# Patient Record
Sex: Female | Born: 1965 | Race: White | Hispanic: No | Smoking: Former smoker
Health system: Southern US, Community
[De-identification: ages and names within clinical notes are randomized; demographics above are authoritative.]

## PROBLEM LIST (undated history)

## (undated) DIAGNOSIS — F32A Depression, unspecified: Secondary | ICD-10-CM

## (undated) DIAGNOSIS — T7840XA Allergy, unspecified, initial encounter: Secondary | ICD-10-CM

## (undated) DIAGNOSIS — F329 Major depressive disorder, single episode, unspecified: Secondary | ICD-10-CM

## (undated) DIAGNOSIS — R87619 Unspecified abnormal cytological findings in specimens from cervix uteri: Secondary | ICD-10-CM

## (undated) DIAGNOSIS — I341 Nonrheumatic mitral (valve) prolapse: Secondary | ICD-10-CM

## (undated) DIAGNOSIS — R87810 Cervical high risk human papillomavirus (HPV) DNA test positive: Secondary | ICD-10-CM

## (undated) DIAGNOSIS — J339 Nasal polyp, unspecified: Secondary | ICD-10-CM

## (undated) DIAGNOSIS — K219 Gastro-esophageal reflux disease without esophagitis: Secondary | ICD-10-CM

## (undated) HISTORY — PX: COLPOSCOPY: SHX161

## (undated) HISTORY — DX: Depression, unspecified: F32.A

## (undated) HISTORY — DX: Cervical high risk human papillomavirus (HPV) DNA test positive: R87.810

## (undated) HISTORY — DX: Allergy, unspecified, initial encounter: T78.40XA

## (undated) HISTORY — DX: Major depressive disorder, single episode, unspecified: F32.9

## (undated) HISTORY — DX: Nonrheumatic mitral (valve) prolapse: I34.1

## (undated) HISTORY — PX: CHOLECYSTECTOMY: SHX55

## (undated) HISTORY — DX: Gastro-esophageal reflux disease without esophagitis: K21.9

## (undated) HISTORY — DX: Unspecified abnormal cytological findings in specimens from cervix uteri: R87.619

---

## 2012-04-30 ENCOUNTER — Encounter: Payer: Self-pay | Admitting: Family Medicine

## 2013-05-24 ENCOUNTER — Encounter: Payer: Self-pay | Admitting: Family Medicine

## 2016-10-17 LAB — LIPID PANEL
CHOLESTEROL: 240 mg/dL — AB (ref 0–200)
HDL: 82 mg/dL — AB (ref 35–70)
LDL CALC: 126 mg/dL
LDL/HDL RATIO: 1.5
Triglycerides: 159 mg/dL (ref 40–160)

## 2016-10-17 LAB — HEMOGLOBIN A1C: HEMOGLOBIN A1C: 5.5

## 2016-10-17 LAB — HEPATIC FUNCTION PANEL
ALK PHOS: 98 U/L (ref 25–125)
ALT: 13 U/L (ref 7–35)
AST: 16 U/L (ref 13–35)
BILIRUBIN, TOTAL: 0.4 mg/dL

## 2016-10-17 LAB — BASIC METABOLIC PANEL
CREATININE: 1 mg/dL (ref <=1.1)
GLUCOSE: 106 mg/dL

## 2017-01-06 ENCOUNTER — Telehealth: Payer: Self-pay | Admitting: Family Medicine

## 2017-01-06 NOTE — Telephone Encounter (Signed)
Medication: Prozac 10mg  once daily  omprozole 20mg  (currently) Flonase nasal spray   Preferred Pharmacy and which med where: [facility] [address]  90 day supply/mail order:  Local pharmacy:    Allergies verified: Dilaudid/ elevated heart rate   Immunization Status: Patient Unknown  Prompted for insurance verification: BCBS Flu vaccine-- Current  Tdap-- PNA-- Shingles--  A/P:   Changes to FH, PSH or Personal Hx: No  Pap-- Not current/ Needs her at our office 3 years ago  MMG-- about 3 years ago   Bone Density-- Never had CCS-- never ha   Care Teams Updated: ED/Hospital/Urgent Care Visits: No  Prompted for: Updated insurance, contact information, forms: Remind to bring: DPR information, advance directives:   To Discuss with Provider:  Larey SeatFell down some stairs and left leg will not heal. December 16th accident occurred. New to the area.    Haven't been to a dentist on about 5 years

## 2017-01-07 ENCOUNTER — Encounter: Payer: Self-pay | Admitting: Family Medicine

## 2017-01-07 ENCOUNTER — Ambulatory Visit (INDEPENDENT_AMBULATORY_CARE_PROVIDER_SITE_OTHER): Payer: BLUE CROSS/BLUE SHIELD | Admitting: Family Medicine

## 2017-01-07 VITALS — BP 118/78 | HR 65 | Temp 98.4°F | Ht 62.0 in | Wt 188.2 lb

## 2017-01-07 DIAGNOSIS — E669 Obesity, unspecified: Secondary | ICD-10-CM

## 2017-01-07 DIAGNOSIS — S8012XD Contusion of left lower leg, subsequent encounter: Secondary | ICD-10-CM

## 2017-01-07 DIAGNOSIS — K219 Gastro-esophageal reflux disease without esophagitis: Secondary | ICD-10-CM

## 2017-01-07 DIAGNOSIS — J301 Allergic rhinitis due to pollen: Secondary | ICD-10-CM

## 2017-01-07 DIAGNOSIS — Z1239 Encounter for other screening for malignant neoplasm of breast: Secondary | ICD-10-CM

## 2017-01-07 DIAGNOSIS — Z1211 Encounter for screening for malignant neoplasm of colon: Secondary | ICD-10-CM

## 2017-01-07 DIAGNOSIS — Z1231 Encounter for screening mammogram for malignant neoplasm of breast: Secondary | ICD-10-CM

## 2017-01-07 DIAGNOSIS — F341 Dysthymic disorder: Secondary | ICD-10-CM | POA: Diagnosis not present

## 2017-01-07 MED ORDER — FLUOXETINE HCL 10 MG PO TABS: 10.0000 mg | ORAL_TABLET | Freq: Every day | ORAL | 3 refills | Status: DC

## 2017-01-07 NOTE — Progress Notes (Signed)
Brooke DawleyLisa Drake is a 51 y.o. female [facility]   History of Present Illness:    1. Dysthymia. On Prozac, states that it "takes the edge off." [place]. Had trouble selling her home. Her father died. She is divorced from an alcoholic. Found that group counseling, and "12 step" programs have helped her. No substance abuse. Involved in church. Likes to Agricultural consultantvolunteer. Extrovert. No SI/HI. Has teenagers. Needs refills today. Would like to find another "codependency" group if possible.    2. Contusion of left lower leg, subsequent encounter. Fall with left lower leg contusion about tone month ago. Now, has a "dent" in that area. Some numbness and itching in the area. No new symptoms otherwise. No other trauma or treatment.     Health Maintenance Due  Topic Date Due  . Hepatitis C Screening  05/18/1966  . HIV Screening  03/28/1979  . TETANUS/TDAP  03/28/1983  . COLONOSCOPY  03/27/2014 ORDERED TODAY  . MAMMOGRAM  11/16/2015 ORDERED TODAY  . PAP SMEAR  11/15/2016    PMHx, SurgHx, SocialHx, Medications, and Allergies were reviewed in the Visit Navigator and updated as appropriate.    Past Medical History:  Diagnosis Date  . Allergy   . Depression   . GERD (gastroesophageal reflux disease)     Past Surgical History:  Procedure Laterality Date  . CHOLECYSTECTOMY      Family History  Problem Relation Age of Onset  . Cancer Mother   . Mental illness Mother   . Cancer Father   . Heart disease Father   . Heart disease Maternal Grandmother   . Stroke Maternal Grandfather     Social History  Substance Use Topics  . Smoking status: Never Smoker  . Smokeless tobacco: Never Used  . Alcohol use 1.8 - 2.4 oz/week    3 - 4 Glasses of wine per week    Current Medications and Allergies:    Current Outpatient Prescriptions:  .  FLUoxetine (PROZAC) 10 MG tablet, Take 1 tablet (10 mg total) by mouth daily., Disp: 90 tablet, Rfl: 3 .  fluticasone (FLONASE)  50 MCG/ACT nasal spray, Place into both nostrils daily., Disp: , Rfl:  .  omeprazole (PRILOSEC) 10 MG capsule, Take 10 mg by mouth daily., Disp: , Rfl:    Allergies  Allergen Reactions  . Dilaudid [Hydromorphone] Other (See Comments)    Elevated heart rate.      Patient Information Form: Screening and ROS    Review of Systems  General:  Negative for unexplained weight loss, fever Skin: Negative for new or changing mole, sore that won't heal HEENT: Negative for trouble hearing, trouble seeing, ringing in ears, mouth sores, hoarseness, change in voice, dysphagia CV:  Negative for chest pain, dyspnea, edema, palpitations Resp: Negative for cough, dyspnea, hemoptysis GI: Negative for nausea, vomiting, diarrhea, constipation, abdominal pain, melena, hematochezia GU: Negative for dysuria, incontinence, urinary hesitance, hematuria, vaginal discharge, polyuria, sexual difficulty, lumps in breasts MSK: Negative for muscle cramps or aches, joint pain or swelling Neuro: Negative for headaches, weakness, numbness, dizziness, passing out/fainting Psych: Negative for depression, anxiety, memory problems   Vitals:   Vitals:   01/07/17 1100  BP: 118/78  Pulse: 65  Temp: 98.4 F (36.9 C)  TempSrc: Oral  SpO2: 98%  Weight: 188 lb 3.2 oz (85.4 kg)  Height: 5\' 2"  (1.575 m)     Body mass index is 34.42 kg/m.   Physical Exam:    General: Alert, cooperative, appears  stated age and no distress.  HEENT:  Normocephalic, without obvious abnormality, atraumatic. Conjunctivae/corneas clear. PERRL, EOM's intact. Normal TM's and external ear canals both ears. Nares normal. Septum midline. Mucosa normal. No drainage or sinus tenderness. Lips, mucosa, and tongue normal; teeth and gums normal.  Lungs: Clear to auscultation bilaterally.  Heart:: Regular rate and rhythm, S1, S2 normal, no murmur, click, rub or gallop.  Abdomen: Soft, non-tender; bowel sounds normal; no masses,  no organomegaly.    Extremities: Left lower anterolateral shin with healing contusion, mild deformity and residual edema.   Pulses: 2+ and symmetric.  Skin: Skin color, texture, turgor normal. No rashes or lesions.  Neurologic: Alert and oriented X 3, normal strength and tone. Normal symmetric. reflexes. Normal coordination and gait.  Psych: Alert,oriented, in NAD with a full range of affect, normal behavior and no psychotic features     Assessment and Plan:    [name] was seen today for establish care and leg injury.  Diagnoses and all orders for this visit:  Dysthymia -     FLUoxetine (PROZAC) 10 MG tablet; Take 1 tablet (10 mg total) by mouth daily.  Contusion of left lower leg, subsequent encounter Comments: No red flags. Recommend compression hose and reviewed red flags.   Gastroesophageal reflux disease without esophagitis  Chronic seasonal allergic rhinitis due to pollen  Obesity (BMI 30.0-34.9)  Screening for colon cancer -     HM COLONOSCOPY  Screening for breast cancer -     MM SCREENING BREAST TOMO BILATERAL; Future   . Reviewed expectations re: course of current medical issues. . Discussed self-management of symptoms. . Outlined signs and symptoms indicating need for more acute intervention. . Patient verbalized understanding and all questions were answered. . See orders for this visit as documented in the electronic medical record. . Patient received an After Visit Summary.  Records requested if needed. I spent 45 minutes with this patient, greater than 50% was face-to-face time counseling regarding the above diagnoses.   Helane Rima, D.O. , [facility] 01/07/2017   Follow-up: Return in about 4 weeks (around 02/04/2017) for a physical with PAP.  Meds ordered this encounter  Medications  . FLUoxetine (PROZAC) 10 MG tablet    Sig: Take 1 tablet (10 mg total) by mouth daily.    Dispense:  90 tablet    Refill:  3   Medications Discontinued During This  Encounter  Medication Reason  . FLUoxetine (PROZAC) 10 MG tablet Reorder   Orders Placed This Encounter  Procedures  . MM SCREENING BREAST TOMO BILATERAL  . HM COLONOSCOPY

## 2017-01-07 NOTE — Progress Notes (Signed)
Pre visit review using our clinic review tool, if applicable. No additional management support is needed unless otherwise documented below in the visit note. 

## 2017-01-07 NOTE — Patient Instructions (Signed)
Community Resources  Advocacy/Legal Legal Aid [place]:  1-866-219-5262  /  336-272-0148  Family Justice Center:  336-641-7233  [facility] [place] 24-hr Crisis line:  336-273-7273  Women's Resource Center, [place]:  336-275-6090  Court Watch (custody):  336-275-2346  [place] [facility]:   336-279-9299    Baby & Breastfeeding Car Seat Inspection @ Various [place] Fire Depts.- call 336-373-2177  [place] [facility]  336-832-6860  [facility] 336-878-6712  WIC: 336-641-3663 ([place]);  336-641-7571 ([place])  La Leche League:  1-877-452-5321   Childcare [place] [facility]: 336-369-5097 ([place]) / 336-887-8224 ([place])  - Child Care Resources/ Referrals/ Scholarships  - Head Start/ Early Head Start (call or apply online)  [place] DHHS: [place] Pre-K :  1-800-859-0829 / 336-274-5437   Employment / Job Search [facility] [place]: 336-275-6090 / [address]  [employer] (JobLink): 336-373-5922 ([place]) / 336-882-4141 ([place])  [facility] Community Resource/ Career Center: 336-275-9801 / 336-282-7307  [facility] Job & Career Center: 336-373-3764  DHHS Work First: 336-641-3447 ([place]) / 336-641-3447 ([place])  [facility] [place]:  336-676-5871   Financial Assistance [place] [facility]:  336-553-2657  Salvation Army: 336-235-0368  [employer] Network (furniture):  336-370-4002  [place] Helping Hands: 336-373-4264  Low Income Energy Assistance  336-641-3000   Food Assistance DHHS- SNAP/ Food Stamps: 336-641-4588  WIC: [place]- 336-641-3663 ;  [place] 336-641-7571  Little Green Book- Free Meals  Little Blue Book- Free Food Pantries  During the summer, text "FOOD" to 877877   General Health / Clinics (Adults) Orange Card (for Adults) through [place] [facility]: (336) 895-4900  [facility]:   336-832-8035  [facility]:   336-832-4444  Health Department:  336-641-3245  [facility]:  336-415-3877 / 336-641-2100  Planned Parenthood of [place]:   336-373-0678  [facility]:   336-334-4822 x 50251   Housing [place] Housing Coalition:   336-691-9521  [place] Housing Authority:  336-275-8501  Affordable Housing Managemnt:  336-273-0568   Immigrant/ Refugee Center for [place]ians (UNCG):  336-256-1065  Faith Action International House:  336-379-0037  [employer] Institute:  336-937-4701  Church World Services:  336-617-0381  African Services Coalition:  336-574-2677   LGBTQ YouthSAFE  www.youthsafe[place].org  PFLAG  336-541-6754 / info@[place].org  The Trevor Project:  1-866-488-7386   Mental Health/ Substance Use [facility] [place]  336-387-6161  [facility]:  336-832-9700 or 1-800-711-2635  [facility]'s Circle of Care:  336-271-5888  [facility]:  336-294-1349  [facility] Care Services:  336-542-2884  [facility] (walk-ins)  336-676-6840 / [address]  [place]:  800-449-1287  Alcoholics Anonymous:  336-854-4278  Narcotics Anonymous:  800-365-1036  Quit Smoking Hotline:  800-QUIT-NOW (800-784-8669)   Parenting Children's Home Society:  800-632-1400  [place]: Education Center & Support Groups:  336-832-6682  YWCA: 336-273-3461  UNCG: Bringing Out the Best:  336-334-3120               Thriving at Three (Hispanic families): 336-256-1066  Healthy Start ([facility] [place]):  336-387-6161 x2288  Parents as Teachers:  336-691-0024  [place] [facility]- Learning Together (Immigrants): 336-369-5001   Poison Control 800-222-1222  Sports & Recreation [employer] Open Doors Application: ymcanwnc.org/join/open-doors-financial-assistance/  City of [place] Recreation Centers: http://www.[place].gov/index.aspx?page=3615   Special Needs Family Support Network:  336-832-6507  Autism Society of [place]:   336-333-0197 x1402 or x1412 /  800-785-1035  TEACCH [place]:  336-334-5773     ARC of [place]:  336-373-1076  Children's Developmental Service Agency (CDSA):  336-334-5601  CC4C (Care Coordination for Children):  336-641-7641   Transportation Medicaid Transportation: 336-641-4848 to apply  [place] Transit Authority: 336-335-6499 (reduced-fare bus ID to Medicaid/ Medicare/ Orange Card)  SCAT Paratransit services: Eligible riders only, call 336-333-6589 for application   Tutoring/Mentoring Black Child Development Institute: 336-230-2138  Big Brothers/ Big Sisters: 336-378-9100 ([place])  336-882-4167 ([place])  ACES through child's school: 336-370-2321  [employer] Achievers: contact your local Y  SHIELD Mentor Program: 336-337-2771   

## 2017-02-06 ENCOUNTER — Encounter: Payer: Self-pay | Admitting: Family Medicine

## 2017-02-06 ENCOUNTER — Other Ambulatory Visit (HOSPITAL_COMMUNITY)
Admission: RE | Admit: 2017-02-06 | Discharge: 2017-02-06 | Disposition: A | Discharge status: Home / Self Care | Payer: BLUE CROSS/BLUE SHIELD | Source: Ambulatory Visit | Attending: Family Medicine | Admitting: Family Medicine

## 2017-02-06 ENCOUNTER — Ambulatory Visit (INDEPENDENT_AMBULATORY_CARE_PROVIDER_SITE_OTHER): Payer: BLUE CROSS/BLUE SHIELD | Admitting: Family Medicine

## 2017-02-06 VITALS — BP 128/78 | HR 75 | Temp 98.4°F | Ht 63.39 in | Wt 188.2 lb

## 2017-02-06 DIAGNOSIS — Z23 Encounter for immunization: Secondary | ICD-10-CM

## 2017-02-06 DIAGNOSIS — Z124 Encounter for screening for malignant neoplasm of cervix: Secondary | ICD-10-CM

## 2017-02-06 DIAGNOSIS — Z0000 Encounter for general adult medical examination without abnormal findings: Secondary | ICD-10-CM

## 2017-02-06 DIAGNOSIS — S8012XD Contusion of left lower leg, subsequent encounter: Secondary | ICD-10-CM

## 2017-02-06 NOTE — Progress Notes (Signed)
Pre visit review using our clinic review tool, if applicable. No additional management support is needed unless otherwise documented below in the visit note. 

## 2017-02-06 NOTE — Progress Notes (Signed)
Subjective:   Insurance claims handlerAmber Agner, CMA, acting as scribe for Dr. Earlene PlaterWallace.  Brooke DawleyLisa Verdun is a 51 y.o. female and is here for a comprehensive physical exam.  HPI: No concerns today.  Health Maintenance Due  Topic Date Due  . HIV Screening  03/28/1979  . TETANUS/TDAP  03/28/1983  . MAMMOGRAM  11/16/2015  . PAP SMEAR  11/15/2016   PMHx, SurgHx, SocialHx, Medications, and Allergies were reviewed in the Visit Navigator and updated as appropriate.   Past Medical History:  Diagnosis Date  . Allergy   . Depression   . GERD (gastroesophageal reflux disease)      Past Surgical History:  Procedure Laterality Date  . CHOLECYSTECTOMY       Family History  Problem Relation Age of Onset  . Cancer Mother   . Mental illness Mother   . Cancer Father   . Heart disease Father   . Heart disease Maternal Grandmother   . Stroke Maternal Grandfather     Social History  Substance Use Topics  . Smoking status: Never Smoker  . Smokeless tobacco: Never Used  . Alcohol use 1.8 - 2.4 oz/week    3 - 4 Glasses of wine per week    Review of Systems:   Review of Systems  Constitutional: Negative for chills and fever.  HENT: Negative for congestion and sore throat.   Eyes: Negative for blurred vision.  Respiratory: Negative for cough and shortness of breath.   Cardiovascular: Negative for chest pain and palpitations.  Gastrointestinal: Negative for abdominal pain, nausea and vomiting.  Genitourinary: Negative for frequency.  Musculoskeletal: Negative for back pain.  Skin: Negative for rash.  Neurological: Negative for loss of consciousness and headaches.  Psychiatric/Behavioral: Negative for depression. The patient is not nervous/anxious.     Objective:   BP 128/78   Pulse 75   Temp 98.4 F (36.9 C) (Oral)   Ht 5' 3.39" (1.61 m)   Wt 188 lb 3.2 oz (85.4 kg)   SpO2 98%   BMI 32.93 kg/m  Body mass index is 32.93 kg/m.   General Appearance:    Alert, cooperative, no distress,  appears stated age  Head:    Normocephalic, without obvious abnormality, atraumatic  Eyes:    PERRL, conjunctiva/corneas clear, EOM's intact, fundi    benign, both eyes  Ears:    Normal TM's and external ear canals, both ears  Nose:   Nares normal, septum midline, mucosa normal, no drainage    or sinus tenderness  Throat:   Lips, mucosa, and tongue normal; teeth and gums normal  Neck:   Supple, symmetrical, trachea midline, no adenopathy;    thyroid:  no enlargement/tenderness/nodules; no carotid   bruit or JVD  Back:     Symmetric, no curvature, ROM normal, no CVA tenderness  Lungs:     Clear to auscultation bilaterally, respirations unlabored  Chest Wall:    No tenderness or deformity   Heart:    Regular rate and rhythm, S1 and S2 normal, no murmur, rub   or gallop  Abdomen:     Soft, non-tender, bowel sounds active all four quadrants,    no masses, no organomegaly  Genitalia:    Normal female without lesion, discharge or tenderness  Extremities:   Still with noticeable healing contusion to left lower shin, still ttp.  Pulses:   2+ and symmetric all extremities  Skin:   Skin color, texture, turgor normal, no rashes or lesions  Lymph nodes:   Cervical, supraclavicular,  and axillary nodes normal  Neurologic:   CNII-XII intact, normal strength, sensation and reflexes    throughout    Assessment/Plan:   Niketa was seen today for annual exam.  Diagnoses and all orders for this visit:  Routine physical examination  Screening for cervical cancer -     Cytology - PAP  Need for Tdap vaccination  Contusion of left lower leg, subsequent encounter Comments: Still inhibits exercise after 4 months. Will refer to Dr. Berline Chough to make sure that it is healing appropriately.  Orders: -     AMB referral to sports medicine  Need for diphtheria-tetanus-pertussis (Tdap) vaccine -     Tdap vaccine greater than or equal to 7yo IM    Well Adult Exam: Labs ordered: Yes. Patient counseling was  done. See below for items discussed. Discussed the patient's BMI.  The BMI BMI is not in the acceptable range; BMI management plan is completed Follow up in 3 months. Breast cancer screening: pending. Cervical cancer screening: completed today   Patient Counseling: [x]    Nutrition: Stressed importance of moderation in sodium/caffeine intake, saturated fat and cholesterol, caloric balance, sufficient intake of fresh fruits, vegetables, fiber, calcium, iron, and 1 mg of folate supplement per day (for females capable of pregnancy).  [x]    Stressed the importance of regular exercise.   [x]    Substance Abuse: Discussed cessation/primary prevention of tobacco, alcohol, or other drug use; driving or other dangerous activities under the influence; availability of treatment for abuse.   [x]    Injury prevention: Discussed safety belts, safety helmets, smoke detector, smoking near bedding or upholstery.   [x]    Sexuality: Discussed sexually transmitted diseases, partner selection, use of condoms, avoidance of unintended pregnancy  and contraceptive alternatives.  [x]    Dental health: Discussed importance of regular tooth brushing, flossing, and dental visits.  [x]    Health maintenance and immunizations reviewed. Please refer to Health maintenance section.   Helane Rima, DO [facility]

## 2017-02-10 ENCOUNTER — Ambulatory Visit (INDEPENDENT_AMBULATORY_CARE_PROVIDER_SITE_OTHER): Payer: BLUE CROSS/BLUE SHIELD | Admitting: Sports Medicine

## 2017-02-10 ENCOUNTER — Encounter: Payer: Self-pay | Admitting: Sports Medicine

## 2017-02-10 ENCOUNTER — Ambulatory Visit: Payer: Self-pay

## 2017-02-10 VITALS — BP 118/82 | HR 83 | Ht 63.25 in | Wt 185.6 lb

## 2017-02-10 DIAGNOSIS — S8012XD Contusion of left lower leg, subsequent encounter: Secondary | ICD-10-CM

## 2017-02-10 DIAGNOSIS — M7632 Iliotibial band syndrome, left leg: Secondary | ICD-10-CM

## 2017-02-10 DIAGNOSIS — S8012XA Contusion of left lower leg, initial encounter: Secondary | ICD-10-CM | POA: Insufficient documentation

## 2017-02-10 DIAGNOSIS — S86812A Strain of other muscle(s) and tendon(s) at lower leg level, left leg, initial encounter: Secondary | ICD-10-CM

## 2017-02-10 DIAGNOSIS — M79605 Pain in left leg: Secondary | ICD-10-CM | POA: Diagnosis not present

## 2017-02-10 LAB — CERVICOVAGINAL ANCILLARY ONLY: Herpes: NEGATIVE

## 2017-02-10 MED ORDER — NITROGLYCERIN 0.2 MG/HR TD PT24: MEDICATED_PATCH | TRANSDERMAL | 1 refills | Status: DC

## 2017-02-10 NOTE — Patient Instructions (Signed)
Please perform the exercise program that Brooke Drake has prepared for you and gone over in detail on a daily basis.  In addition to the handout you were provided you can access your program through: [email or web address]   Your unique program code is: [email or web address]

## 2017-02-10 NOTE — Assessment & Plan Note (Signed)
Symptoms are consistent with a distal anterior tibialis musculotendinous strain/tear.  There is a small amount of subcutaneous edema.   Nitroglycerin protocol Body Healix compression Therapeutic exercises reviewed with athletic training staff focusing on ankle intrinsic motions as well as hip/ITB strengthening/stretching.

## 2017-02-10 NOTE — Progress Notes (Signed)
OFFICE VISIT NOTE Brooke Drake. Brooke Drake Sports Medicine [facility] [place] [facility] at [facility] [phone]  Viney Acocella - 51 y.o. female MRN [id number]  Date of birth: [birth year]  Visit Date: 02/10/2017  PCP: Helane Rima, DO   Referred by: Helane Rima, DO  SUBJECTIVE:   Chief Complaint  Patient presents with  . left leg    contusion left lower leg. Pt fell down stairs December 16th 2017 and injured left leg. She has bruise/dent that will not go away. She does still have pain in the area. Leg is tender to palpation will radiate up.    HPI: As above. Additional pertinent information includes:  Above history reviewed myself. Additional pertinent history includes: Left anterior leg pain with Onset/duration & Mechanism/Context as above  The pain is described as achy and constant and is rated as mild to moderately currently.  Worsened with excessive walking or direct palpation Improves with rest and elevation  Other associated symptoms include: none   ROS: ROS  Otherwise per HPI.  HISTORY & PERTINENT PRIOR DATA:  No specialty comments available. She reports that she has never smoked. She has never used smokeless tobacco.  Recent Labs  10/17/16  HGBA1C 5.5   Medications & Allergies reviewed per EMR Patient Active Problem List   Diagnosis Date Noted  . Strain of left tibialis anterior muscle 02/10/2017  . Contusion of left lower leg 02/10/2017  . Dysthymia 01/07/2017  . Gastroesophageal reflux disease without esophagitis 01/07/2017  . Chronic seasonal allergic rhinitis due to pollen 01/07/2017  . Obesity (BMI 30.0-34.9) 01/07/2017   Past Medical History:  Diagnosis Date  . Allergy   . Depression   . GERD (gastroesophageal reflux disease)    Family History  Problem Relation Age of Onset  . Cancer Mother   . Mental illness Mother   . Cancer Father   . Heart disease Father   . Heart disease Maternal Grandmother   . Stroke Maternal Grandfather    Past  Surgical History:  Procedure Laterality Date  . CHOLECYSTECTOMY     Social History   Occupational History  . Not on file.   Social History Main Topics  . Smoking status: Never Smoker  . Smokeless tobacco: Never Used  . Alcohol use 1.8 - 2.4 oz/week    3 - 4 Glasses of wine per week  . Drug use: No  . Sexual activity: Not Currently    Partners: Male    Birth control/ protection: None    OBJECTIVE:  VS:  HT:5' 3.25" (160.7 cm)   WT:185 lb 9.6 oz (84.2 kg)  BMI:32.7    BP:118/82  HR:83bpm  TEMP: ( )  RESP:99 % Physical Exam  Constitutional: She appears well-developed and well-nourished. She is cooperative.  Non-toxic appearance.  HENT:  Head: Normocephalic and atraumatic.  Cardiovascular: Intact distal pulses.   Pulmonary/Chest: No accessory muscle usage. No respiratory distress.  Neurological: She is alert. She is not disoriented. She displays normal reflexes. No sensory deficit.  Skin: Skin is warm, dry and intact. Capillary refill takes less than 2 seconds. No abrasion and no rash noted.  Psychiatric: She has a normal mood and affect. Her speech is normal and behavior is normal. Thought content normal.   LEFT LEG:  Overall well aligned although small indentation along the left anterior leg. Slight darkening of the skin without significant bruising or ecchymosis Full ankle and knee range of motion with normal four-way ankle strength Slight TTP over the left anterior  shin Negative hop test Slight weakness and hip abduction on the left with moderate TTP over the lateral femoral condyle.  Positive Noble test.  IMAGING & PROCEDURES: No results found. Findings:  PROCEDURE NOTE: THERAPEUTIC EXERCISES (97110) 15 minutes spent for Therapeutic exercises as stated in above notes.  This included exercises focusing on stretching, strengthening, with significant focus on eccentric aspects.   Proper technique shown and discussed handout in great detail with ATC.  All questions  were discussed and answered.    LIMITED MSK ULTRASOUND OF LEFT ANKLE Images were obtained and interpreted by myself, Gaspar Bidding, DO  Images have been saved and stored to PACS system. Images obtained on: GE S7 Ultrasound machine  FINDINGS:   Left anterior tibialis muscle with change neovascularity with superficial subcutaneous edema  IMPRESSION:  1. Left anterior tibialis direct contusion with underlying muscle strain    ASSESSMENT & PLAN:  Visit Diagnoses:  1. Left leg pain   2. Strain of left tibialis anterior muscle, initial encounter   3. Contusion of left lower leg, subsequent encounter   4. Iliotibial band syndrome of left side    Meds:  Meds ordered this encounter  Medications  . nitroGLYCERIN (NITRODUR - DOSED IN MG/24 HR) 0.2 mg/hr patch    Sig: Place 1/4 of patch over affected region. Remove and replace once daily.  Slightly alter skin placement daily    Dispense:  30 patch    Refill:  1    Orders:  Orders Placed This Encounter  Procedures  . [place] LIMITED JOINT SPACE STRUCTURES LOW LEFT(NO LINKED CHARGES)    Follow-up: Return in about 6 weeks (around 03/24/2017).   Otherwise please see problem oriented charting as below.

## 2017-02-11 LAB — CYTOLOGY - PAP
Bacterial vaginitis: NEGATIVE
Candida vaginitis: NEGATIVE
Chlamydia: NEGATIVE
Diagnosis: NEGATIVE
HPV: DETECTED — AB
Neisseria Gonorrhea: NEGATIVE
Trichomonas: NEGATIVE

## 2017-02-12 ENCOUNTER — Ambulatory Visit: Payer: BLUE CROSS/BLUE SHIELD | Admitting: Sports Medicine

## 2017-02-24 DIAGNOSIS — M7632 Iliotibial band syndrome, left leg: Secondary | ICD-10-CM | POA: Insufficient documentation

## 2017-02-24 NOTE — Assessment & Plan Note (Signed)
Slow to heal contusion with associated strain/tear.

## 2017-02-24 NOTE — Assessment & Plan Note (Signed)
Likely secondary due to altered gait mechanics.  Hip abduction strengthening IT band stretching reviewed with athletic training staff.

## 2017-02-27 ENCOUNTER — Encounter: Payer: Self-pay | Admitting: Sports Medicine

## 2017-03-26 ENCOUNTER — Ambulatory Visit: Payer: Self-pay

## 2017-03-26 ENCOUNTER — Encounter: Payer: Self-pay | Admitting: Sports Medicine

## 2017-03-26 ENCOUNTER — Ambulatory Visit (INDEPENDENT_AMBULATORY_CARE_PROVIDER_SITE_OTHER): Payer: BLUE CROSS/BLUE SHIELD | Admitting: Sports Medicine

## 2017-03-26 VITALS — BP 102/82 | HR 76 | Ht 63.25 in | Wt 189.8 lb

## 2017-03-26 DIAGNOSIS — S8012XD Contusion of left lower leg, subsequent encounter: Secondary | ICD-10-CM

## 2017-03-26 DIAGNOSIS — S86812A Strain of other muscle(s) and tendon(s) at lower leg level, left leg, initial encounter: Secondary | ICD-10-CM | POA: Diagnosis not present

## 2017-03-26 NOTE — Progress Notes (Signed)
OFFICE VISIT NOTE Veverly FellsMichael D. Delorise Shinerigby, DO  [facility] [place] [phone]  Annamarie DawleyLisa Sharpless - 51 y.o. female MRN [id number]  Date of birth: 1966/06/28  Visit Date: 03/26/2017  PCP: Helane RimaWallace, Erica, DO   Referred by: Helane RimaWallace, Erica, DO  Orlie DakinBrandy Shelton, CMA acting as scribe for Dr. Berline Choughigby.  SUBJECTIVE:   Chief Complaint  Patient presents with  . Follow-up    left leg contusion, IT band syndrome   HPI: As below and per problem based documentation when appropriate.  Pt presents today in follow-up of left leg contustion 10/2016 Pt fell down the stairs and struck her anterior shin stent.  The pain is described as intermittent stabbing, like getting a tattoo and is rated as 4/10 when it is severe.  Currently 0 out of 10.  Pain is worse when on her feet for extended periods of time.  Improves with resting Therapies tried include body helix brace, pt has tried Advil but she doesn't really like taking medications.  Using nitroglycerin protocol without any adverse effects.  Other associated symptoms include: She feels intense pins and needles in the calf when wearing the brace.   Pt denies fever, chills, night sweats    Review of Systems  Constitutional: Negative for chills and fever.  Respiratory: Negative for shortness of breath.   Cardiovascular: Negative for chest pain and leg swelling.  Gastrointestinal: Negative for constipation and diarrhea.  Neurological: Positive for tingling. Negative for dizziness and headaches.  Endo/Heme/Allergies: Bruises/bleeds easily.    Otherwise per HPI.  HISTORY & PERTINENT PRIOR DATA:  No specialty comments available. She reports that she has never smoked. She has never used smokeless tobacco.   Recent Labs  10/17/16  HGBA1C 5.5   Medications & Allergies reviewed per EMR Patient Active Problem List   Diagnosis Date Noted  . Iliotibial band syndrome of left side 02/24/2017  . Strain of left tibialis  anterior muscle 02/10/2017  . Contusion of left lower leg 02/10/2017  . Dysthymia 01/07/2017  . Gastroesophageal reflux disease without esophagitis 01/07/2017  . Chronic seasonal allergic rhinitis due to pollen 01/07/2017  . Obesity (BMI 30.0-34.9) 01/07/2017   Past Medical History:  Diagnosis Date  . Allergy   . Depression   . GERD (gastroesophageal reflux disease)    Family History  Problem Relation Age of Onset  . Cancer Mother   . Mental illness Mother   . Cancer Father   . Heart disease Father   . Heart disease Maternal Grandmother   . Stroke Maternal Grandfather    Past Surgical History:  Procedure Laterality Date  . CHOLECYSTECTOMY     Social History   Occupational History  . Not on file.   Social History Main Topics  . Smoking status: Never Smoker  . Smokeless tobacco: Never Used  . Alcohol use 1.8 - 2.4 oz/week    3 - 4 Glasses of wine per week  . Drug use: No  . Sexual activity: Not Currently    Partners: Male    Birth control/ protection: None    OBJECTIVE:  VS:  HT:5' 3.25" (160.7 cm)   WT:189 lb 12.8 oz (86.1 kg)  BMI:33.4    BP:102/82  HR:76bpm  TEMP: ( )  RESP:97 % EXAM: Findings:  WDWN, NAD, Non-toxic appearing Alert & appropriately interactive Not depressed or anxious appearing No increased work of breathing. Pupils are equal. EOM intact without nystagmus No clubbing or cyanosis of the extremities appreciated No significant rashes/lesions/ulcerations  overlying the examined area. DP & PT pulses 2+/4.  No significant pretibial edema.  No clubbing or cyanosis Sensation intact to light touch in lower extremities.    Left leg: Overall well aligned.  She has a moderate size indentation of the left lateral lower third of her shin.  This is only mildly tender.  The darkening of the skin previously noted has improved.  Negative Tinel's over this area today. Ankle intrinsic strength is 5/5 in all  motions.  ++++++++++++++++++++++++++++++++++++++++++++++++++++++++++++++++++ LIMITED MSK ULTRASOUND OF left shin Images were obtained and interpreted by myself, Gaspar Bidding, DO  Images have been saved and stored to PACS system. Images obtained on: GE S7 Ultrasound machine  FINDINGS:  Midportion of the anterior tibialis muscle belly has hypoechoic change as well as an interstitial split with associated increased neovascularity that was not previously seen.  Is likely attributed to the nitroglycerin protocol.    IMPRESSION:  Healing anterior tibialis muscle strain/contusion      No results found. ASSESSMENT & PLAN:   Problem List Items Addressed This Visit    Strain of left tibialis anterior muscle    She does have interstitial tear of the anterior tibialis muscle near its origin. Seems to be healing well.  Small amount of residual subcutaneous edema. Continue nitroglycerin protocol as well as body Healix compression sleeve Ice as needed. Continue with intrinsic ankle motion strengthening.      Contusion of left lower leg - Primary   Relevant Orders   [place] LIMITED JOINT SPACE STRUCTURES LOW LEFT(NO LINKED CHARGES)      Follow-up: Return in about 6 weeks (around 05/07/2017) for repeat clinical exam.   CMA/ATC served as scribe during this visit. History, Physical, and Plan performed by medical provider. Documentation and orders reviewed and attested to.      Gaspar Bidding, DO    [facility]    03/26/2017 1:45 PM

## 2017-03-26 NOTE — Patient Instructions (Signed)
Continue with the nitroglycerin protocol as well as the body Healix compression sleeve.  You should wear the compression when you are on your feet.  If you continue to have the tingling please note when this is occurring and send us a message in my chart

## 2017-03-26 NOTE — Assessment & Plan Note (Signed)
She does have interstitial tear of the anterior tibialis muscle near its origin. Seems to be healing well.  Small amount of residual subcutaneous edema. Continue nitroglycerin protocol as well as body Healix compression sleeve Ice as needed. Continue with intrinsic ankle motion strengthening.

## 2017-05-07 ENCOUNTER — Ambulatory Visit: Payer: BLUE CROSS/BLUE SHIELD | Admitting: Sports Medicine

## 2017-05-09 ENCOUNTER — Encounter: Payer: Self-pay | Admitting: Sports Medicine

## 2017-05-09 ENCOUNTER — Ambulatory Visit (INDEPENDENT_AMBULATORY_CARE_PROVIDER_SITE_OTHER): Payer: BLUE CROSS/BLUE SHIELD | Admitting: Sports Medicine

## 2017-05-09 VITALS — BP 110/80 | HR 79 | Ht 63.25 in | Wt 197.0 lb

## 2017-05-09 DIAGNOSIS — S86812A Strain of other muscle(s) and tendon(s) at lower leg level, left leg, initial encounter: Secondary | ICD-10-CM

## 2017-05-09 DIAGNOSIS — S8012XD Contusion of left lower leg, subsequent encounter: Secondary | ICD-10-CM | POA: Diagnosis not present

## 2017-05-09 NOTE — Progress Notes (Signed)
OFFICE VISIT NOTE Amoura Ransier D. Delorise Shinerigby, DO   [facility] [place] Veverly [phone]  Annamarie DawleyLisa Scheirer - 51 y.o. female MRN [id number]  Date of birth: 28-Oct-1966  Visit Date: 05/09/2017  PCP: Helane RimaWallace, Erica, DO   Referred by: Helane RimaWallace, Erica, DO  Orlie DakinBrandy Shelton, CMA acting as scribe for Dr. Berline Choughigby.  SUBJECTIVE:   Chief Complaint  Patient presents with  . Follow-up    Contusion of left lower leg, strain of the left tibialis anterior muscle   HPI: As below and per problem based documentation when appropriate.  Pt presents today in follow-up of ontusion of left lower leg and strain of the left tibialis anterior muscle. Ultrasound was done 03/26/17 which showed the following: IMPRESSION:  Healing anterior tibialis muscle strain/contusion  Pt was encouraged to continue following the Nitro Protocol, using the Body Helix compression sleeve, home exercises and icing as needed.   Pt reports that there has not been much change in in leg since her last visit. A couple of weeks ago she had intense pain on the lateral aspect of the lower left leg which lasted 1-2 days. She is wearing her Body Helix almost every day. She notices improvement when wearing the Body Helix. She is still following the Nitro Protocol with no side effects. She does the home exercises intermittently but admits that she doesn't do them nearly as often as she should. She has not had to use ice because she says that it she doesn't feel that she needs to. She says that at this point she feels like this is something that she just has to live with because its not getting better but its not getting worse either.   Pt denies fever, chills, night sweats.     Review of Systems  Constitutional: Negative for chills and fever.  Respiratory: Negative for shortness of breath and wheezing.   Cardiovascular: Negative for chest pain, palpitations and leg swelling.  Musculoskeletal: Negative for falls.    Neurological: Negative for dizziness, tingling and headaches.  Endo/Heme/Allergies: Does not bruise/bleed easily.    Otherwise per HPI.  HISTORY & PERTINENT PRIOR DATA:  No specialty comments available. She reports that she has never smoked. She has never used smokeless tobacco.   Recent Labs  10/17/16  HGBA1C 5.5   Medications & Allergies reviewed per EMR Patient Active Problem List   Diagnosis Date Noted  . Iliotibial band syndrome of left side 02/24/2017  . Strain of left tibialis anterior muscle 02/10/2017  . Contusion of left lower leg 02/10/2017  . Dysthymia 01/07/2017  . Gastroesophageal reflux disease without esophagitis 01/07/2017  . Chronic seasonal allergic rhinitis due to pollen 01/07/2017  . Obesity (BMI 30.0-34.9) 01/07/2017   Past Medical History:  Diagnosis Date  . Allergy   . Depression   . GERD (gastroesophageal reflux disease)    Family History  Problem Relation Age of Onset  . Cancer Mother   . Mental illness Mother   . Cancer Father   . Heart disease Father   . Heart disease Maternal Grandmother   . Stroke Maternal Grandfather    Past Surgical History:  Procedure Laterality Date  . CHOLECYSTECTOMY     Social History   Occupational History  . Not on file.   Social History Main Topics  . Smoking status: Never Smoker  . Smokeless tobacco: Never Used  . Alcohol use 1.8 - 2.4 oz/week    3 - 4 Glasses of wine per week  . Drug  use: No  . Sexual activity: Not Currently    Partners: Male    Birth control/ protection: None    OBJECTIVE:  VS:  HT:5' 3.25" (160.7 cm)   WT:197 lb (89.4 kg)  BMI:34.7    BP:110/80  HR:79bpm  TEMP: ( )  RESP:98 % EXAM: Findings:  Left leg is overall well aligned.  No significant deformity although there is a slight indentation in the anterior musculature.  No significant skin lesions.  No surrounding erythema.  Dorsiflexion plantarflexion strength is normal.  DP PT pulses 2+/4.  There is a slight decreased  sensation just distal and lateral to area examined.     No results found. ASSESSMENT & PLAN:     ICD-10-CM   1. Strain of left tibialis anterior muscle, initial encounter S86.812A   2. Contusion of left lower leg, subsequent encounter S80.12XD   ================================================================= Strain of left tibialis anterior muscle Continued to have mild discomfort.  She has not been performing her therapeutic exercises and these were emphasized with her today to focus on the intrinsic ankle motions focusing on anterior compartment lateral compartment and tibialis muscles.  She will continue with nitroglycerin for an additional 6 weeks and if any lack of improvement or worsening plan to follow-up otherwise follow-up as needed. =================================================================  Follow-up: Return if symptoms worsen or fail to improve.   CMA/ATC served as Neurosurgeon during this visit. History, Physical, and Plan performed by medical provider. Documentation and orders reviewed and attested to.      Gaspar Bidding, DO    [facility]

## 2017-05-09 NOTE — Assessment & Plan Note (Signed)
Continued to have mild discomfort.  She has not been performing her therapeutic exercises and these were emphasized with her today to focus on the intrinsic ankle motions focusing on anterior compartment lateral compartment and tibialis muscles.  She will continue with nitroglycerin for an additional 6 weeks and if any lack of improvement or worsening plan to follow-up otherwise follow-up as needed.

## 2017-11-11 DIAGNOSIS — R87810 Cervical high risk human papillomavirus (HPV) DNA test positive: Secondary | ICD-10-CM

## 2017-11-11 HISTORY — DX: Cervical high risk human papillomavirus (HPV) DNA test positive: R87.810

## 2018-01-07 ENCOUNTER — Other Ambulatory Visit: Payer: Self-pay | Admitting: Family Medicine

## 2018-01-07 DIAGNOSIS — F341 Dysthymic disorder: Secondary | ICD-10-CM

## 2018-01-15 ENCOUNTER — Other Ambulatory Visit: Payer: Self-pay

## 2018-01-15 DIAGNOSIS — F341 Dysthymic disorder: Secondary | ICD-10-CM

## 2018-01-15 NOTE — Telephone Encounter (Signed)
MEDICATION:  Fluoxetine 10mg    PHARMACY:  [facility] [address]   IS THIS A 90 DAY SUPPLY : yes   IS PATIENT OUT OF MEDICATION:   IF NOT; HOW MUCH IS LEFT:   LAST APPOINTMENT DATE: @2 /27/2019  NEXT APPOINTMENT DATE:@Visit  date not found  OTHER COMMENTS: l/m for patient to call for f/u last app 02/06/17 with wallace.    **Let patient know to contact pharmacy at the end of the day to make sure medication is ready. **  ** Please notify patient to allow 48-72 hours to process**  **Encourage patient to contact the pharmacy for refills or they can [facility]**

## 2018-01-21 ENCOUNTER — Other Ambulatory Visit: Payer: Self-pay | Admitting: Family Medicine

## 2018-01-21 DIAGNOSIS — F341 Dysthymic disorder: Secondary | ICD-10-CM

## 2018-01-26 ENCOUNTER — Ambulatory Visit: Payer: BLUE CROSS/BLUE SHIELD | Admitting: Family Medicine

## 2018-02-16 ENCOUNTER — Ambulatory Visit (INDEPENDENT_AMBULATORY_CARE_PROVIDER_SITE_OTHER): Payer: BLUE CROSS/BLUE SHIELD | Admitting: Family Medicine

## 2018-02-16 ENCOUNTER — Encounter: Payer: Self-pay | Admitting: Family Medicine

## 2018-02-16 VITALS — BP 112/78 | HR 80 | Temp 98.1°F | Ht 63.25 in | Wt 182.0 lb

## 2018-02-16 DIAGNOSIS — Z13220 Encounter for screening for lipoid disorders: Secondary | ICD-10-CM

## 2018-02-16 DIAGNOSIS — Z79899 Other long term (current) drug therapy: Secondary | ICD-10-CM

## 2018-02-16 DIAGNOSIS — B977 Papillomavirus as the cause of diseases classified elsewhere: Secondary | ICD-10-CM | POA: Diagnosis not present

## 2018-02-16 DIAGNOSIS — Z0000 Encounter for general adult medical examination without abnormal findings: Secondary | ICD-10-CM | POA: Diagnosis not present

## 2018-02-16 LAB — COMPREHENSIVE METABOLIC PANEL
ALT: 13 U/L (ref 0–35)
AST: 21 U/L (ref 0–37)
Albumin: 4.2 g/dL (ref 3.5–5.2)
Alkaline Phosphatase: 79 U/L (ref 39–117)
BUN: 7 mg/dL (ref 6–23)
CO2: 30 mEq/L (ref 19–32)
Calcium: 9.8 mg/dL (ref 8.4–10.5)
Chloride: 103 mEq/L (ref 96–112)
Creatinine, Ser: 0.81 mg/dL (ref 0.40–1.20)
GFR: 78.35 mL/min (ref >60.00)
Glucose, Bld: 97 mg/dL (ref 70–99)
Potassium: 3.6 mEq/L (ref 3.5–5.1)
Sodium: 141 mEq/L (ref 135–145)
Total Bilirubin: 0.6 mg/dL (ref 0.2–1.2)
Total Protein: 7.5 g/dL (ref 6.0–8.3)

## 2018-02-16 LAB — LIPID PANEL
Cholesterol: 199 mg/dL (ref 0–200)
HDL: 59.6 mg/dL (ref >39.00)
LDL Cholesterol: 121 mg/dL — ABNORMAL HIGH (ref 0–99)
NonHDL: 138.93
Total CHOL/HDL Ratio: 3
Triglycerides: 88 mg/dL (ref 0.0–149.0)
VLDL: 17.6 mg/dL (ref 0.0–40.0)

## 2018-02-16 LAB — CBC WITH DIFFERENTIAL/PLATELET
Basophils Absolute: 0.2 10*3/uL — ABNORMAL HIGH (ref 0.0–0.1)
Basophils Relative: 2.1 % (ref 0.0–3.0)
Eosinophils Absolute: 0.2 10*3/uL (ref 0.0–0.7)
Eosinophils Relative: 2.9 % (ref 0.0–5.0)
HCT: 41.2 % (ref 36.0–46.0)
Hemoglobin: 13.6 g/dL (ref 12.0–15.0)
Lymphocytes Relative: 30.6 % (ref 12.0–46.0)
Lymphs Abs: 2.4 10*3/uL (ref 0.7–4.0)
MCHC: 33.1 g/dL (ref 30.0–36.0)
MCV: 89.5 fl (ref 78.0–100.0)
Monocytes Absolute: 0.5 10*3/uL (ref 0.1–1.0)
Monocytes Relative: 6.2 % (ref 3.0–12.0)
Neutro Abs: 4.5 10*3/uL (ref 1.4–7.7)
Neutrophils Relative %: 58.2 % (ref 43.0–77.0)
Platelets: 318 10*3/uL (ref 150.0–400.0)
RBC: 4.6 Mil/uL (ref 3.87–5.11)
RDW: 14.3 % (ref 11.5–15.5)
WBC: 7.7 10*3/uL (ref 4.0–10.5)

## 2018-02-16 NOTE — Progress Notes (Signed)
Subjective:    Brooke Drake is a 52 y.o. female and is here for a comprehensive physical exam.   Health Maintenance Due  Topic Date Due  . MAMMOGRAM  11/16/2015   PMHx, SurgHx, SocialHx, Medications, and Allergies were reviewed in the Visit Navigator and updated as appropriate.   Past Medical History:  Diagnosis Date  . Allergy   . Depression   . GERD (gastroesophageal reflux disease)     Past Surgical History:  Procedure Laterality Date  . CHOLECYSTECTOMY      Family History  Problem Relation Age of Onset  . Cancer Mother   . Mental illness Mother   . Cancer Father   . Heart disease Father   . Heart disease Maternal Grandmother   . Stroke Maternal Grandfather    Social History   Tobacco Use  . Smoking status: Never Smoker  . Smokeless tobacco: Never Used  Substance Use Topics  . Alcohol use: Yes    Alcohol/week: 1.8 - 2.4 oz    Types: 3 - 4 Glasses of wine per week  . Drug use: No   Review of Systems:   Pertinent items are noted in the HPI. Otherwise, ROS is negative.  Objective:   BP 112/78   Pulse 80   Temp 98.1 F (36.7 C) (Oral)   Ht 5' 3.25" (1.607 m)   Wt 182 lb (82.6 kg)   SpO2 98%   BMI 31.99 kg/m    Wt Readings from Last 3 Encounters:  02/16/18 182 lb (82.6 kg)  05/09/17 197 lb (89.4 kg)  03/26/17 189 lb 12.8 oz (86.1 kg)     Ht Readings from Last 3 Encounters:  02/16/18 5' 3.25" (1.607 m)  05/09/17 5' 3.25" (1.607 m)  03/26/17 5' 3.25" (1.607 m)   General appearance: alert, cooperative and appears stated age. Head: normocephalic, without obvious abnormality, atraumatic. Neck: no adenopathy, supple, symmetrical, trachea midline; thyroid not enlarged, symmetric, no tenderness/mass/nodules. Lungs: clear to auscultation bilaterally. Heart: regular rate and rhythm Abdomen: soft, non-tender; no masses,  no organomegaly. Extremities: extremities normal, atraumatic, no cyanosis or edema. Skin: skin color, texture, turgor normal, no  rashes or lesions. Lymph: cervical, supraclavicular, and axillary nodes normal; no abnormal inguinal nodes palpated. Neurologic: grossly normal.                            Assessment/Plan:   Brooke Drake was seen today for annual exam.  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  The patient states that she has had a colposcopy in the past with HPV differentiation.  She declines today and wants to get old recor   Patient Counseling: [x]    Nutrition: Stressed importance of moderation in sodium/caffeine intake, saturated fat and cholesterol, caloric balance, sufficient intake of fresh fruits, vegetables, fiber, calcium, iron, and 1 mg of folate supplement per day (for females capable of pregnancy).  [x]    Stressed the importance of regular exercise.   [x]    Substance Abuse: Discussed cessation/primary prevention of tobacco, alcohol, or other drug use; driving or other dangerous activities under the influence; availability of treatment for abuse.   [x]    Injury prevention: Discussed safety belts, safety helmets, smoke detector, smoking  near bedding or upholstery.   [x]    Sexuality: Discussed sexually transmitted diseases, partner selection, use of condoms, avoidance of unintended pregnancy  and contraceptive alternatives.  [x]    Dental health: Discussed importance of regular tooth brushing, flossing, and dental visits.  [x]    Health maintenance and immunizations reviewed. Please refer to Health maintenance section.   Briscoe Deutscher, DO [place]

## 2018-02-17 ENCOUNTER — Telehealth: Payer: Self-pay

## 2018-02-17 NOTE — Telephone Encounter (Signed)
Copied from CRM [phone]. Topic: General - Other >> Feb 17, 2018 11:23 AM Gerrianne ScalePayne, Angela L wrote: Reason for CRM: patient calling wanting a nurse to give her a call back pertaining information [place]

## 2018-02-18 NOTE — Telephone Encounter (Signed)
Left message on voicemail to call office.  

## 2018-02-20 ENCOUNTER — Ambulatory Visit: Payer: BLUE CROSS/BLUE SHIELD | Admitting: Family Medicine

## 2018-02-20 ENCOUNTER — Encounter: Payer: Self-pay | Admitting: Family Medicine

## 2018-02-20 ENCOUNTER — Ambulatory Visit
Admission: RE | Admit: 2018-02-20 | Discharge: 2018-02-20 | Disposition: A | Discharge status: Home / Self Care | Payer: BLUE CROSS/BLUE SHIELD | Source: Ambulatory Visit | Attending: Family Medicine | Admitting: Family Medicine

## 2018-02-20 VITALS — BP 108/72 | HR 73 | Temp 98.1°F | Ht 63.25 in | Wt 182.4 lb

## 2018-02-20 DIAGNOSIS — H6981 Other specified disorders of Eustachian tube, right ear: Secondary | ICD-10-CM

## 2018-02-20 DIAGNOSIS — M6740 Ganglion, unspecified site: Secondary | ICD-10-CM

## 2018-02-20 DIAGNOSIS — H6991 Unspecified Eustachian tube disorder, right ear: Secondary | ICD-10-CM

## 2018-02-20 DIAGNOSIS — E7800 Pure hypercholesterolemia, unspecified: Secondary | ICD-10-CM | POA: Diagnosis not present

## 2018-02-20 DIAGNOSIS — Z1239 Encounter for other screening for malignant neoplasm of breast: Secondary | ICD-10-CM

## 2018-02-20 NOTE — Telephone Encounter (Signed)
Patient is coming in today.

## 2018-02-20 NOTE — Patient Instructions (Signed)
Eustachian Tube Dysfunction The eustachian tube connects the middle ear to the back of the nose. It regulates air pressure in the middle ear by allowing air to move between the ear and nose. It also helps to drain fluid from the middle ear space. When the eustachian tube does not function properly, air pressure, fluid, or both can build up in the middle ear. Eustachian tube dysfunction can affect one or both ears. What are the causes? This condition happens when the eustachian tube becomes blocked or cannot open normally. This may result from:  Ear infections.  Colds and other upper respiratory infections.  Allergies.  Irritation, such as from cigarette smoke or acid from the stomach coming up into the esophagus (gastroesophageal reflux).  Sudden changes in air pressure, such as from descending in an airplane.  Abnormal growths in the nose or throat, such as nasal polyps, tumors, or enlarged tissue at the back of the throat (adenoids).  What increases the risk? This condition may be more likely to develop in people who smoke and people who are overweight. Eustachian tube dysfunction may also be more likely to develop in children, especially children who have:  Certain birth defects of the mouth, such as cleft palate.  Large tonsils and adenoids.  What are the signs or symptoms? Symptoms of this condition may include:  A feeling of fullness in the ear.  Ear pain.  Clicking or popping noises in the ear.  Ringing in the ear.  Hearing loss.  Loss of balance.  Symptoms may get worse when the air pressure around you changes, such as when you travel to an area of high elevation or fly on an airplane. How is this diagnosed? This condition may be diagnosed based on:  Your symptoms.  A physical exam of your ear, nose, and throat.  Tests, such as those that measure: ? The movement of your eardrum (tympanogram). ? Your hearing (audiometry).  How is this treated? Treatment  depends on the cause and severity of your condition. If your symptoms are mild, you may be able to relieve your symptoms by moving air into ("popping") your ears. If you have symptoms of fluid in your ears, treatment may include:  Decongestants.  Antihistamines.  Nasal sprays or ear drops that contain medicines that reduce swelling (steroids).  In some cases, you may need to have a procedure to drain the fluid in your eardrum (myringotomy). In this procedure, a small tube is placed in the eardrum to:  Drain the fluid.  Restore the air in the middle ear space.  Follow these instructions at home:  Take over-the-counter and prescription medicines only as told by your health care provider.  Use techniques to help pop your ears as recommended by your health care provider. These may include: ? Chewing gum. ? Yawning. ? Frequent, forceful swallowing. ? Closing your mouth, holding your nose closed, and gently blowing as if you are trying to blow air out of your nose.  Do not do any of the following until your health care provider approves: ? Travel to high altitudes. ? Fly in airplanes. ? Work in a pressurized cabin or room. ? Scuba dive.  Keep your ears dry. Dry your ears completely after showering or bathing.  Do not smoke.  Keep all follow-up visits as told by your health care provider. This is important. Contact a health care provider if:  Your symptoms do not go away after treatment.  Your symptoms come back after treatment.  You are   unable to pop your ears.  You have: ? A fever. ? Pain in your ear. ? Pain in your head or neck. ? Fluid draining from your ear.  Your hearing suddenly changes.  You become very dizzy.  You lose your balance. This information is not intended to replace advice given to you by your health care provider. Make sure you discuss any questions you have with your health care provider. Document Released: 11/24/2015 Document Revised: 04/04/2016  Document Reviewed: 11/16/2014 Elsevier Interactive Patient Education  2018 [employer]  Ganglion Cyst A ganglion cyst is a noncancerous, fluid-filled lump that occurs near joints or tendons. The ganglion cyst grows out of a joint or the lining of a tendon. It most often develops in the hand or wrist, but it can also develop in the shoulder, elbow, hip, knee, ankle, or foot. The round or oval ganglion cyst can be the size of a pea or larger than a grape. Increased activity may enlarge the size of the cyst because more fluid starts to build up. What are the causes? It is not known what causes a ganglion cyst to grow. However, it may be related to:  Inflammation or irritation around the joint.  An injury.  Repetitive movements or overuse.  Arthritis.  What increases the risk? Risk factors include:  Being a woman.  Being age 54-50.  What are the signs or symptoms? Symptoms may include:  A lump. This most often appears on the hand or wrist, but it can occur in other areas of the body.  Tingling.  Pain.  Numbness.  Muscle weakness.  Weak grip.  Less movement in a joint.  How is this diagnosed? Ganglion cysts are most often diagnosed based on a physical exam. Your health care provider will feel the lump and may shine a light alongside it. If it is a ganglion cyst, a light often shines through it. Your health care provider may order an X-ray, ultrasound, or MRI to rule out other conditions. How is this treated? Ganglion cysts usually go away on their own without treatment. If pain or other symptoms are involved, treatment may be needed. Treatment is also needed if the ganglion cyst limits your movement or if it gets infected. Treatment may include:  Wearing a brace or splint on your wrist or finger.  Taking anti-inflammatory medicine.  Draining fluid from the lump with a needle (aspiration).  Injecting a steroid into the joint.  Surgery to remove the ganglion  cyst.  Follow these instructions at home:  Do not press on the ganglion cyst, poke it with a needle, or hit it.  Take medicines only as directed by your health care provider.  Wear your brace or splint as directed by your health care provider.  Watch your ganglion cyst for any changes.  Keep all follow-up visits as directed by your health care provider. This is important. Contact a health care provider if:  Your ganglion cyst becomes larger or more painful.  You have increased redness, red streaks, or swelling.  You have pus coming from the lump.  You have weakness or numbness in the affected area.  You have a fever or chills. This information is not intended to replace advice given to you by your health care provider. Make sure you discuss any questions you have with your health care provider. Document Released: 10/25/2000 Document Revised: 04/04/2016 Document Reviewed: 04/12/2014 Elsevier Interactive Patient Education  2018 [employer]

## 2018-02-20 NOTE — Progress Notes (Signed)
Brooke DawleyLisa Drake is a 52 y.o. female is here for follow up.  History of Present Illness:   HPI: Patient presents to discuss right ear fullness.  She wants to make sure that it is not full of wax.  She does feel some discomfort in the ear and has noticed popping.  History of allergic rhinitis.  Using Flonase.  She would like to discuss a small cyst on her bilateral forearms.  No change in size.  No pain.  Lesh-colored.  Health Maintenance Due  Topic Date Due  . MAMMOGRAM  11/16/2015   Depression screen PHQ 2/9 02/06/2017 01/07/2017  Decreased Interest 0 0  Down, Depressed, Hopeless 0 0  PHQ - 2 Score 0 0   PMHx, SurgHx, SocialHx, FamHx, Medications, and Allergies were reviewed in the Visit Navigator and updated as appropriate.   Patient Active Problem List   Diagnosis Date Noted  . Iliotibial band syndrome of left side 02/24/2017  . Strain of left tibialis anterior muscle 02/10/2017  . Contusion of left lower leg 02/10/2017  . Dysthymia 01/07/2017  . Gastroesophageal reflux disease without esophagitis 01/07/2017  . Chronic seasonal allergic rhinitis due to pollen 01/07/2017  . Obesity (BMI 30.0-34.9) 01/07/2017   Social History   Tobacco Use  . Smoking status: Never Smoker  . Smokeless tobacco: Never Used  Substance Use Topics  . Alcohol use: Yes    Alcohol/week: 1.8 - 2.4 oz    Types: 3 - 4 Glasses of wine per week  . Drug use: No   Current Medications and Allergies:   .  FLUoxetine (PROZAC) 10 MG tablet, TAKE 1 TABLET (10 MG TOTAL) BY MOUTH DAILY., Disp: 90 tablet, Rfl: 3 .  fluticasone (FLONASE) 50 MCG/ACT nasal spray, Place into both nostrils daily., Disp: , Rfl:    Allergies  Allergen Reactions  . Dilaudid [Hydromorphone] Other (See Comments)    Elevated heart rate.    Review of Systems   Pertinent items are noted in the HPI. Otherwise, ROS is negative.  Vitals:   Vitals:   02/20/18 1116  BP: 108/72  Pulse: 73  Temp: 98.1 F (36.7 C)  TempSrc: Oral    SpO2: 96%  Weight: 182 lb 6.4 oz (82.7 kg)  Height: 5' 3.25" (1.607 m)     Body mass index is 32.06 kg/m.   Physical Exam:   Physical Exam  Constitutional: She appears well-developed and well-nourished. No distress.  HENT:  Head: Normocephalic and atraumatic.  Right Ear: External ear normal.  Left Ear: External ear normal.  Nose: Nose normal.  Mouth/Throat: Oropharynx is clear and moist.  Eyes: Pupils are equal, round, and reactive to light. EOM are normal.  Neck: Normal range of motion. Neck supple.  Cardiovascular: Normal rate, regular rhythm, normal heart sounds and intact distal pulses.  Pulmonary/Chest: Effort normal.  Abdominal: Soft.  Musculoskeletal:       Arms: Skin: Skin is warm.  Psychiatric: She has a normal mood and affect. Her behavior is normal.  Nursing note and vitals reviewed.   Assessment and Plan:   Diagnoses and all orders for this visit:  ETD (Eustachian tube dysfunction), right Comments: I advised that she start an antihistamine.  She will start Claritin.  Ganglion cyst Comments: Most consistent with a benign cyst.  Red flags reviewed.  Pure hypercholesterolemia Comments: Slightly elevated but reassuring high HDL.   Marland Kitchen. Reviewed expectations re: course of current medical issues. . Discussed self-management of symptoms. . Outlined signs and symptoms indicating need for  more acute intervention. . Patient verbalized understanding and all questions were answered. Marland Kitchen Health Maintenance issues including appropriate healthy diet, exercise, and smoking avoidance were discussed with patient. . See orders for this visit as documented in the electronic medical record. . Patient received an After Visit Summary.  Helane Rima, DO [place] 02/20/2018  Future Appointments  Date Time Provider Department Center  02/20/2018  2:40 PM [facility] 2 [facility]

## 2018-04-02 ENCOUNTER — Telehealth: Payer: Self-pay | Admitting: Family Medicine

## 2018-04-02 DIAGNOSIS — G8310 Monoplegia of lower limb affecting unspecified side: Secondary | ICD-10-CM | POA: Insufficient documentation

## 2018-04-02 NOTE — Telephone Encounter (Signed)
Copied from CRM [id number]. Topic: Quick Communication - See Telephone Encounter >> Apr 02, 2018  1:50 PM Clack, Princella Pellegrini wrote: CRM for notification. See Telephone encounter for: 04/02/18.  Pt would someone to call her about her labs (PAP) and medical records, from her other PCP.

## 2018-04-03 NOTE — Telephone Encounter (Signed)
Called patient back she states she filled out ROI in february and in April to get notes from old provider for records on last pap. I have looked but I do not see can you look behind me. Informed patient I would give call on Tuesday and give her some information.

## 2018-04-08 NOTE — Telephone Encounter (Signed)
Diannia Ruder, can you check on this ROI?

## 2018-04-08 NOTE — Telephone Encounter (Signed)
I have checked up front and we do not have it in paperwork. Once we get the ROI and it is filled out completely by the patient we scan it into the demographics section under documents. Unfortunately, I do not see it in documents section.

## 2018-04-09 NOTE — Telephone Encounter (Signed)
Patient came into the office and Quitman Livings has assisted with getting the paperwork filled out, scan the ROI into documents, faxed and contacted their office to ensure they received it and send the records as soon as possible.   Ellie please update note once all is done.

## 2018-04-09 NOTE — Telephone Encounter (Signed)
I (Ellie) ensured that the ROI was filled out correctly. I then called [facility], the office from which we need the records, where I spoke to [facility] in the medical records department who assisted me and let me know I could write her name on the fax sheet so she can assist in expediting the process. The ROI has been faxed to [facility] and a successful fax notification has been received. I scanned the ROI as well as the successful fax sheet to the patient's chart under "Documents". I advised the patient that she will get a call when we receive her records, per her request.

## 2018-04-09 NOTE — Telephone Encounter (Signed)
I attempted to contact the patient to discuss and offer to email the ROI to help due to the inconvenience. I left a voicemail for the patient to return the phone call or we would assist once she arrives at the office.

## 2018-04-09 NOTE — Telephone Encounter (Signed)
Patient calling to inquire about the information she was needing. Relayed the message that unfortunately, the ROI was not seen in the documents section. Patient states that she will be by the office to complete the ROI again.

## 2018-04-09 NOTE — Telephone Encounter (Signed)
The patient states that when we call her to inform her that her records have been received by our office at [facility], it is okay to leave a voicemail on her phone number that is on the ROI.

## 2018-04-27 NOTE — Telephone Encounter (Signed)
The patient would like a call today regarding her medical records.

## 2018-04-27 NOTE — Telephone Encounter (Signed)
Results given to you. Patient just wants to know when  And what she should do for f/u pap.

## 2018-04-27 NOTE — Telephone Encounter (Signed)
Needs repeat PAP or okay referral to GYN for colposcopy.

## 2018-04-28 NOTE — Telephone Encounter (Signed)
She would like referral I will send I will talk to Mellody Dancekeith in am about to make sure old notes get sent as well.

## 2018-04-30 ENCOUNTER — Other Ambulatory Visit: Payer: Self-pay

## 2018-04-30 ENCOUNTER — Telehealth: Payer: Self-pay | Admitting: Obstetrics and Gynecology

## 2018-04-30 DIAGNOSIS — R87618 Other abnormal cytological findings on specimens from cervix uteri: Secondary | ICD-10-CM

## 2018-04-30 NOTE — Telephone Encounter (Signed)
Called and left a message for patient to call back to schedule a new patient doctor referral appointment with our office. °

## 2018-04-30 NOTE — Telephone Encounter (Signed)
Referral started with note that I have records.

## 2018-04-30 NOTE — Telephone Encounter (Signed)
[facility] stated that she reached out to the patient to get scheduled for her referral and the patient was very upset and did not understand why she needed a referral and declined getting another pap.   [facility] would like Dr. Philis PiqueWallace's team to reach out to the patient to explain the referral once more for the patient so there is some clarity for her.    Copied from CRM [phone]. Topic: General - Other >> Apr 30, 2018  2:37 PM Tamela OddiHarris, Brenda J wrote: Reason for CRM: [facility].  She stated that patient does not know what the referral is for.  Would like some clarity.  CB# [phone]

## 2018-05-01 NOTE — Telephone Encounter (Signed)
I have reviewed in depth with patient over the phone x2 in the last few days before the referral was made. Any ideas on how I should handel this?

## 2018-05-01 NOTE — Telephone Encounter (Signed)
Patient has the right to decline. I can discuss this with her at a future visit. Make sure records will be available for me to review with her at that time.

## 2018-05-05 DIAGNOSIS — M25512 Pain in left shoulder: Secondary | ICD-10-CM | POA: Insufficient documentation

## 2018-05-05 DIAGNOSIS — M5416 Radiculopathy, lumbar region: Secondary | ICD-10-CM | POA: Insufficient documentation

## 2018-05-06 ENCOUNTER — Ambulatory Visit: Payer: BLUE CROSS/BLUE SHIELD | Admitting: Obstetrics and Gynecology

## 2018-05-06 ENCOUNTER — Other Ambulatory Visit: Payer: Self-pay

## 2018-05-06 ENCOUNTER — Encounter: Payer: Self-pay | Admitting: Gastroenterology

## 2018-05-06 ENCOUNTER — Encounter: Payer: Self-pay | Admitting: Obstetrics and Gynecology

## 2018-05-06 ENCOUNTER — Other Ambulatory Visit (HOSPITAL_COMMUNITY)
Admission: RE | Admit: 2018-05-06 | Discharge: 2018-05-06 | Disposition: A | Discharge status: Home / Self Care | Payer: BLUE CROSS/BLUE SHIELD | Source: Ambulatory Visit | Attending: Obstetrics and Gynecology | Admitting: Obstetrics and Gynecology

## 2018-05-06 VITALS — BP 110/66 | HR 70 | Resp 16 | Ht 63.0 in | Wt 175.8 lb

## 2018-05-06 DIAGNOSIS — Z01419 Encounter for gynecological examination (general) (routine) without abnormal findings: Secondary | ICD-10-CM | POA: Insufficient documentation

## 2018-05-06 DIAGNOSIS — Z1211 Encounter for screening for malignant neoplasm of colon: Secondary | ICD-10-CM | POA: Diagnosis not present

## 2018-05-06 NOTE — Patient Instructions (Addendum)

## 2018-05-06 NOTE — Progress Notes (Signed)
52 y.o. G2P2 Divorced Caucasian female here for annual exam and evaluation of abnormal pap.    No hot flashes.  Never took HRT.   Weaning off Prozac.  MVI daily.   Moved from [place] to be with family.   PCP:  Carmon Ginsberg, DO   Patient's last menstrual period was 11/11/2013 (approximate).           Sexually active: No.  The current method of family planning is post menopausal status.    Exercising: Yes.    gym 3x/week. Smoker:  Former  Health Maintenance: Pap: 02-06-17 Neg:Pos HR Pos History of abnormal Pap:  Yes, Hx of LGSIL:Pos HR HPV 2014 MMG: 03-03-18 Density B/Neg/BiRads1 Colonoscopy:  NEVER.   BMD:   n/a  Result  n/a TDaP:  02-06-17 Gardasil:   no HIV: Donates blood Hep C: donates blood Screening Labs:   --    reports that she has quit smoking. She has never used smokeless tobacco. She reports that she drinks about 1.8 - 2.4 oz of alcohol per week. She reports that she does not use drugs.  Past Medical History:  Diagnosis Date  . Abnormal Pap smear of cervix   . Allergy   . Depression   . GERD (gastroesophageal reflux disease)     Past Surgical History:  Procedure Laterality Date  . CHOLECYSTECTOMY    . COLPOSCOPY      Current Outpatient Medications  Medication Sig Dispense Refill  . FLUoxetine (PROZAC) 10 MG tablet TAKE 1 TABLET (10 MG TOTAL) BY MOUTH DAILY. 90 tablet 3  . fluticasone (FLONASE) 50 MCG/ACT nasal spray Place into both nostrils daily.     No current facility-administered medications for this visit.     Family History  Problem Relation Age of Onset  . Cancer Mother 4  . Mental illness Mother   . Cancer Father 55       Mets--pancreas/lung  . Heart disease Father   . Heart disease Maternal Grandmother   . Stroke Maternal Grandfather     Review of Systems  Constitutional: Negative.   HENT: Negative.   Eyes: Negative.   Respiratory: Negative.   Cardiovascular: Negative.   Gastrointestinal: Negative.   Endocrine: Negative.    Genitourinary: Negative.   Musculoskeletal: Negative.   Skin: Negative.   Allergic/Immunologic: Negative.   Neurological: Negative.   Hematological: Negative.   Psychiatric/Behavioral: Negative.     Exam:   BP 110/66 (BP Location: Right Arm, Patient Position: Sitting, Cuff Size: Normal)   Pulse 70   Resp 16   Ht 5\' 3"  (1.6 m)   Wt 175 lb 12.8 oz (79.7 kg)   LMP 11/11/2013 (Approximate)   BMI 31.14 kg/m     General appearance: alert, cooperative and appears stated age Head: Normocephalic, without obvious abnormality, atraumatic Neck: no adenopathy, supple, symmetrical, trachea midline and thyroid normal to inspection and palpation Lungs: clear to auscultation bilaterally Breasts: normal appearance, no masses or tenderness, No nipple retraction or dimpling, No nipple discharge or bleeding, No axillary or supraclavicular adenopathy Heart: regular rate and rhythm Abdomen: soft, non-tender; no masses, no organomegaly Extremities: extremities normal, atraumatic, no cyanosis or edema Skin: Skin color, texture, turgor normal. No rashes or lesions Lymph nodes: Cervical, supraclavicular, and axillary nodes normal. No abnormal inguinal nodes palpated Neurologic: Grossly normal  Pelvic: External genitalia:  no lesions              Urethra:  normal appearing urethra with no masses, tenderness or lesions  Bartholins and Skenes: normal                 Vagina: normal appearing vagina with normal color and discharge, no lesions              Cervix: no lesions              Pap taken: Yes.   Bimanual Exam:  Uterus:  normal size, contour, position, consistency, mobility, non-tender              Adnexa: no mass, fullness, tenderness              Rectal exam: Yes.  .  Confirms.              Anus:  normal sphincter tone, no lesions  Chaperone was present for exam.  Assessment:   Well woman visit with normal exam. Positive HR HPV.  Hx LGSIL.  Plan: Mammogram  screening. Recommended self breast awareness. Pap and HR HPV as above. Possible colposcopy depending on pap and HR HPV results. We discussed abnormal paps, HPV, and colposcopy.  Guidelines for Calcium, Vitamin D, regular exercise program including cardiovascular and weight bearing exercise. Referral to GI for colonoscopy.  Labs done through PCP.  Follow up annually and prn.   After visit summary provided.

## 2018-05-11 ENCOUNTER — Encounter: Payer: Self-pay | Admitting: Obstetrics and Gynecology

## 2018-05-11 LAB — CYTOLOGY - PAP
Diagnosis: NEGATIVE
HPV: DETECTED — AB

## 2018-05-12 ENCOUNTER — Other Ambulatory Visit: Payer: Self-pay | Admitting: *Deleted

## 2018-05-12 DIAGNOSIS — R87810 Cervical high risk human papillomavirus (HPV) DNA test positive: Secondary | ICD-10-CM

## 2018-05-25 ENCOUNTER — Encounter: Payer: Self-pay | Admitting: Obstetrics and Gynecology

## 2018-05-25 ENCOUNTER — Ambulatory Visit: Payer: BLUE CROSS/BLUE SHIELD | Admitting: Obstetrics and Gynecology

## 2018-05-25 ENCOUNTER — Other Ambulatory Visit: Payer: Self-pay

## 2018-05-25 VITALS — BP 122/76 | HR 72 | Resp 16 | Ht 63.0 in | Wt 177.0 lb

## 2018-05-25 DIAGNOSIS — N949 Unspecified condition associated with female genital organs and menstrual cycle: Secondary | ICD-10-CM

## 2018-05-25 DIAGNOSIS — R87810 Cervical high risk human papillomavirus (HPV) DNA test positive: Secondary | ICD-10-CM

## 2018-05-25 NOTE — Progress Notes (Signed)
  Subjective:     Patient ID: Annamarie DawleyLisa Tooley, female   DOB: [birth year], 52 y.o.   MRN: [id number]  HPI   Vaginal burning for 3 weeks.  No discharge or odor.  No change in exposures.  No partner change.  No dysuria.   No vaginal bleeding.   Pap History: 05/06/18 Neg:Pos HR HPV 02-06-17 Neg:Pos HR HPV Hx of LGSIL:Pos HR HPV 2014 Review of Systems  LMP: 11/11/13 Contraception: Postmenopausal     Objective:   Physical Exam  Genitourinary:    Colposcopy  Consent for procedure.  3% acetic acid used.  White light and green light filter used.  No lesions seen.  ECC taken and sent to pathology.  Minimal EBL.  No complications.      Assessment:     Normal pap and positive HR HPV.  No lesions seen today.  Vaginal burning.     Plan:     Affirm.  We talked briefly about the forms of vaginitis.  FU ECC.  Precautions given.  We discussed LEEP as a procedure for treating dysplasia.   After visit summary to patient.

## 2018-05-25 NOTE — Patient Instructions (Signed)
Colposcopy, Care After This sheet gives you information about how to care for yourself after your procedure. Your health care provider may also give you more specific instructions. If you have problems or questions, contact your health care provider. What can I expect after the procedure? If you had a colposcopy without a biopsy, you can expect to feel fine right away, but you may have some spotting for a few days. You can go back to your normal activities. If you had a colposcopy with a biopsy, it is common to have:  Soreness and pain. This may last for a few days.  Light-headedness.  Mild vaginal bleeding or dark-colored, grainy discharge. This may last for a few days. The discharge may be due to a solution that was used during the procedure. You may need to wear a sanitary pad during this time.  Spotting for at least 48 hours after the procedure.  Follow these instructions at home:  Take over-the-counter and prescription medicines only as told by your health care provider. Talk with your health care provider about what type of over-the-counter pain medicine and prescription medicine you can start taking again. It is especially important to talk with your health care provider if you take blood-thinning medicine.  Do not drive or use heavy machinery while taking prescription pain medicine.  For at least 3 days after your procedure, or as long as told by your health care provider, avoid: ? Douching. ? Using tampons. ? Having sexual intercourse.  Continue to use birth control (contraception).  Limit your physical activity for the first day after the procedure as told by your health care provider. Ask your health care provider what activities are safe for you.  It is up to you to get the results of your procedure. Ask your health care provider, or the department performing the procedure, when your results will be ready.  Keep all follow-up visits as told by your health care provider.  This is important. Contact a health care provider if:  You develop a skin rash. Get help right away if:  You are bleeding heavily from your vagina or you are passing blood clots. This includes using more than one sanitary pad per hour for 2 hours in a row.  You have a fever or chills.  You have pelvic pain.  You have abnormal, yellow-colored, or bad-smelling vaginal discharge. This could be a sign of infection.  You have severe pain or cramps in your lower abdomen that do not get better with medicine.  You feel light-headed or dizzy, or you faint. Summary  If you had a colposcopy without a biopsy, you can expect to feel fine right away, but you may have some spotting for a few days. You can go back to your normal activities.  If you had a colposcopy with a biopsy, you may notice mild pain and spotting for 48 hours after the procedure.  Avoid douching, using tampons, and having sexual intercourse for 3 days after the procedure or as long as told by your health care provider.  Contact your health care provider if you have bleeding, severe pain, or signs of infection. This information is not intended to replace advice given to you by your health care provider. Make sure you discuss any questions you have with your health care provider. Document Released: 08/18/2013 Document Revised: 06/14/2016 Document Reviewed: 06/14/2016 Elsevier Interactive Patient Education  2018 [employer].   Vaginitis Vaginitis is a condition in which the vaginal tissue swells and becomes  red (inflamed). This condition is most often caused by a change in the normal balance of bacteria and yeast that live in the vagina. This change causes an overgrowth of certain bacteria or yeast, which causes the inflammation. There are different types of vaginitis, but the most common types are: Bacterial vaginosis. Yeast infection (candidiasis). Trichomoniasis vaginitis. This is a sexually transmitted disease (STD). Viral  vaginitis. Atrophic vaginitis. Allergic vaginitis.  What are the causes? The cause of this condition depends on the type of vaginitis. It can be caused by: Bacteria (bacterial vaginosis). Yeast, which is a fungus (yeast infection). A parasite (trichomoniasis vaginitis). A virus (viral vaginitis). Low hormone levels (atrophic vaginitis). Low hormone levels can occur during pregnancy, breastfeeding, or after menopause. Irritants, such as bubble baths, scented tampons, and feminine sprays (allergic vaginitis).  Other factors can change the normal balance of the yeast and bacteria that live in the vagina. These include: Antibiotic medicines. Poor hygiene. Diaphragms, vaginal sponges, spermicides, birth control pills, and intrauterine devices (IUD). Sex. Infection. Uncontrolled diabetes. A weakened defense (immune) system.  What increases the risk? This condition is more likely to develop in women who: Smoke. Use vaginal douches, scented tampons, or scented sanitary pads. Wear tight-fitting pants. Wear thong underwear. Use oral birth control pills or an IUD. Have sex without a condom. Have multiple sex partners. Have an STD. Frequently use the spermicide nonoxynol-9. Eat lots of foods high in sugar. Have uncontrolled diabetes. Have low estrogen levels. Have a weakened immune system from an immune disorder or medical treatment. Are pregnant or breastfeeding.  What are the signs or symptoms? Symptoms vary depending on the cause of the vaginitis. Common symptoms include: Abnormal vaginal discharge. The discharge is white, gray, or yellow with bacterial vaginosis. The discharge is thick, white, and cheesy with a yeast infection. The discharge is frothy and yellow or greenish with trichomoniasis. A bad vaginal smell. The smell is fishy with bacterial vaginosis. Vaginal itching, pain, or swelling. Sex that is painful. Pain or burning when urinating.  Sometimes there are no  symptoms. How is this diagnosed? This condition is diagnosed based on your symptoms and medical history. A physical exam, including a pelvic exam, will also be done. You may also have other tests, including: Tests to determine the pH level (acidity or alkalinity) of your vagina. A whiff test, to assess the odor that results when a sample of your vaginal discharge is mixed with a potassium hydroxide solution. Tests of vaginal fluid. A sample will be examined under a microscope.  How is this treated? Treatment varies depending on the type of vaginitis you have. Your treatment may include: Antibiotic creams or pills to treat bacterial vaginosis and trichomoniasis. Antifungal medicines, such as vaginal creams or suppositories, to treat a yeast infection. Medicine to ease discomfort if you have viral vaginitis. Your sexual partner should also be treated. Estrogen delivered in a cream, pill, suppository, or vaginal ring to treat atrophic vaginitis. If vaginal dryness occurs, lubricants and moisturizing creams may help. You may need to avoid scented soaps, sprays, or douches. Stopping use of a product that is causing allergic vaginitis. Then using a vaginal cream to treat the symptoms.  Follow these instructions at home: Lifestyle Keep your genital area clean and dry. Avoid soap, and only rinse the area with water. Do not douche or use tampons until your health care provider says it is okay to do so. Use sanitary pads, if needed. Do not have sex until your health care provider approves.  When you can return to sex, practice safe sex and use condoms. Wipe from front to back. This avoids the spread of bacteria from the rectum to the vagina. General instructions Take over-the-counter and prescription medicines only as told by your health care provider. If you were prescribed an antibiotic medicine, take or use it as told by your health care provider. Do not stop taking or using the antibiotic even if you  start to feel better. Keep all follow-up visits as told by your health care provider. This is important. How is this prevented? Use mild, non-scented products. Do not use things that can irritate the vagina, such as fabric softeners. Avoid the following products if they are scented: Feminine sprays. Detergents. Tampons. Feminine hygiene products. Soaps or bubble baths. Let air reach your genital area. Wear cotton underwear to reduce moisture buildup. Avoid wearing underwear while you sleep. Avoid wearing tight pants and underwear or nylons without a cotton panel. Avoid wearing thong underwear. Take off any wet clothing, such as bathing suits, as soon as possible. Practice safe sex and use condoms. Contact a health care provider if: You have abdominal pain. You have a fever. You have symptoms that last for more than 2-3 days. Get help right away if: You have a fever and your symptoms suddenly get worse. Summary Vaginitis is a condition in which the vaginal tissue becomes inflamed.This condition is most often caused by a change in the normal balance of bacteria and yeast that live in the vagina. Treatment varies depending on the type of vaginitis you have. Do not douche, use tampons , or have sex until your health care provider approves. When you can return to sex, practice safe sex and use condoms. This information is not intended to replace advice given to you by your health care provider. Make sure you discuss any questions you have with your health care provider. Document Released: 08/25/2007 Document Revised: 12/03/2016 Document Reviewed: 12/03/2016 Elsevier Interactive Patient Education  [employer].

## 2018-05-26 ENCOUNTER — Other Ambulatory Visit: Payer: Self-pay | Admitting: *Deleted

## 2018-05-26 LAB — VAGINITIS/VAGINOSIS, DNA PROBE
CANDIDA SPECIES: NEGATIVE
GARDNERELLA VAGINALIS: POSITIVE — AB
TRICHOMONAS VAG: NEGATIVE

## 2018-05-26 MED ORDER — METRONIDAZOLE 500 MG PO TABS: 500.0000 mg | ORAL_TABLET | Freq: Two times a day (BID) | ORAL | 0 refills | Status: DC

## 2018-05-27 NOTE — Telephone Encounter (Signed)
Pt informed will call for app.

## 2018-05-28 ENCOUNTER — Encounter: Payer: Self-pay | Admitting: Obstetrics and Gynecology

## 2018-05-29 ENCOUNTER — Telehealth: Payer: Self-pay | Admitting: Emergency Medicine

## 2018-05-29 NOTE — Telephone Encounter (Signed)
-----   Message from Patton SallesBrook E Amundson C Silva, MD sent at 05/28/2018  9:58 PM EDT ----- Please report colposcopy results showing benign endocervical and squamous cells.  Please place in 08 recall due to positive HR HPV status.

## 2018-05-29 NOTE — Telephone Encounter (Signed)
Return call to patient and results discussed. Patient agreeable to follow up as directed.  08 Recall entered.  Patient declines to schedule annual exam at this time and will call back for appointment when she knows more about her schedule.  Encounter closed.

## 2018-06-08 ENCOUNTER — Telehealth: Payer: Self-pay | Admitting: Obstetrics and Gynecology

## 2018-06-08 NOTE — Telephone Encounter (Signed)
I recommend a return visit to re-evaluate.  I cannot be sure if she needs more treatment for bacterial vaginosis or if she has yeast vulvovaginitis following the treatment for the BV.

## 2018-06-08 NOTE — Telephone Encounter (Signed)
Message left to return call to Kunio Cummiskey at [phone].    

## 2018-06-08 NOTE — Telephone Encounter (Signed)
Patient states that she has finished all of her antibiotics and is still having discomfort.

## 2018-06-08 NOTE — Telephone Encounter (Signed)
Patient returned call. She states she completed her treatment for bacterial vaginosis on Friday. She continues to experience vaginal irritation and feelings of vaginal "burning" externally.   Denies discharge, denies pelvic pain, denies fevers. No dysuria.  Discussed with patient efficacy of Flagyl and recurrence. She reports not ever feeling resolution of symptoms, but they have not worsened.   Advised will send message to Dr. Edward JollySilva to review and advice for plan of care.

## 2018-06-08 NOTE — Telephone Encounter (Signed)
Spoke with patient, advised as seen below per Dr. Edward JollySilva. OV scheduled for 7/30 at 12pm with Dr. Edward JollySilva. Patient verbalizes understanding and is agreeable. Encounter closed.

## 2018-06-09 ENCOUNTER — Other Ambulatory Visit: Payer: Self-pay

## 2018-06-09 ENCOUNTER — Ambulatory Visit: Payer: BLUE CROSS/BLUE SHIELD | Admitting: Obstetrics and Gynecology

## 2018-06-09 ENCOUNTER — Encounter: Payer: Self-pay | Admitting: Obstetrics and Gynecology

## 2018-06-09 VITALS — BP 104/82 | HR 64 | Wt 174.8 lb

## 2018-06-09 DIAGNOSIS — N762 Acute vulvitis: Secondary | ICD-10-CM | POA: Diagnosis not present

## 2018-06-09 MED ORDER — NYSTATIN-TRIAMCINOLONE 100000-0.1 UNIT/GM-% EX CREA: 1.0000 "application " | TOPICAL_CREAM | Freq: Two times a day (BID) | CUTANEOUS | 0 refills | Status: DC

## 2018-06-09 NOTE — Progress Notes (Signed)
GYNECOLOGY  VISIT   HPI: 52 y.o.   Divorced  Caucasian  female   G2P2 with Patient's last menstrual period was 11/11/2013 (approximate).   here for vaginal pain and irritation. Denies discharge, fever or chills. States she wants to figure this out because she does not want to keep coming back for visits.  Feels uncomfortable and burning for 6 - 8 weeks.  Symptoms are on the outside of the vagina all the time.  Does not get worse with urination.   Affirm testing showed BV. Took Flagyl and no improvement in her symptoms.  Recent colposcopy done for positive HR HPV status.  Biopsy benign.   GYNECOLOGIC HISTORY: Patient's last menstrual period was 11/11/2013 (approximate). Contraception:  Post menopausal Menopausal hormone therapy:  None Last mammogram:  03/03/2018 Birads 1 negative Last pap smear:   05/06/2018 positive HPV, colpo 05/25/2018 results showing benign endocervical and squamous cells.         OB History    Gravida  2   Para  2   Term      Preterm      AB      Living  2     SAB      TAB      Ectopic      Multiple      Live Births                 Patient Active Problem List   Diagnosis Date Noted  . Iliotibial band syndrome of left side 02/24/2017  . Strain of left tibialis anterior muscle 02/10/2017  . Contusion of left lower leg 02/10/2017  . Dysthymia 01/07/2017  . Gastroesophageal reflux disease without esophagitis 01/07/2017  . Chronic seasonal allergic rhinitis due to pollen 01/07/2017  . Obesity (BMI 30.0-34.9) 01/07/2017    Past Medical History:  Diagnosis Date  . Abnormal Pap smear of cervix   . Allergy   . 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 Depression   . GERD (gastroesophageal reflux disease)     Past Surgical History:  Procedure Laterality Date  . CHOLECYSTECTOMY    . COLPOSCOPY      Current Outpatient Medications  Medication Sig Dispense Refill  . fluticasone (FLONASE) 50 MCG/ACT nasal  spray Place into both nostrils daily.     No current facility-administered medications for this visit.      ALLERGIES: Dilaudid [hydromorphone]  Family History  Problem Relation Age of Onset  . Cancer Mother [id number]  . Mental illness Mother   . Cancer Father [id number]       Mets--pancreas/lung  . Heart disease Father   . Heart disease Maternal Grandmother   . Stroke Maternal Grandfather     Social History   Socioeconomic History  . Marital status: Divorced    Spouse name: Not on file  . Number of children: Not on file  . Years of education: Not on file  . Highest education level: Not on file  Occupational History  . Not on file  Social Needs  . Financial resource strain: Not on file  . Food insecurity:    Worry: Not on file    Inability: Not on file  . Transportation needs:    Medical: Not on file    Non-medical: Not on file  Tobacco Use  . Smoking status: Former Games developermoker  . Smokeless tobacco: Never Used  Substance and Sexual Activity  . Alcohol use: Yes    Alcohol/week: 1.8 - 2.4  oz    Types: 3 - 4 Glasses of wine per week  . Drug use: No  . Sexual activity: Not Currently    Partners: Male    Birth control/protection: Post-menopausal    Comment: has been with boyfriend, but not currently  Lifestyle  . Physical activity:    Days per week: Not on file    Minutes per session: Not on file  . Stress: Not on file  Relationships  . Social connections:    Talks on phone: Not on file    Gets together: Not on file    Attends religious service: Not on file    Active member of club or organization: Not on file    Attends meetings of clubs or organizations: Not on file    Relationship status: Not on file  . Intimate partner violence:    Fear of current or ex partner: Not on file    Emotionally abused: Not on file    Physically abused: Not on file    Forced sexual activity: Not on file  Other Topics Concern  . Not on file  Social History Narrative  . Not on file    Review  of Systems  Constitutional: Negative.   HENT: Negative.   Eyes: Negative.   Respiratory: Negative.   Cardiovascular: Negative.   Gastrointestinal: Negative.   Endocrine: Negative.   Genitourinary: Positive for vaginal pain.  Musculoskeletal: Negative.   Skin: Negative.   Allergic/Immunologic: Negative.   Neurological: Negative.   Hematological: Negative.   Psychiatric/Behavioral: Negative.     PHYSICAL EXAMINATION:    BP 104/82 (BP Location: Right Arm, Patient Position: Sitting)   Pulse 64   Wt 174 lb 12.8 oz (79.3 kg)   LMP 11/11/2013 (Approximate)   BMI 30.96 kg/m     General appearance: alert, cooperative and appears stated age   Pelvic: External genitalia:  Pink coloration of the labia minora and the hymenal opening.               Urethra:  normal appearing urethra with no masses, tenderness or lesions              Bartholins and Skenes: normal                 Vagina: normal appearing vagina with normal color and discharge, no lesions              Cervix: no lesions                Bimanual Exam:  Uterus:  normal size, contour, position, consistency, mobility, non-tender              Adnexa: no mass, fullness, tenderness         Chaperone was present for exam.  ASSESSMENT   Vulvar burning.  Recent bacterial vaginosis.  Positive HR HPV.  Colposcopy biopsy benign.   PLAN  We discussed vulvar burning and forms of vaginitis including infectious and atrophy based.  Affirm done today again.  Will treat with Mycolog II twice a day for one week.  We reviewed treatment of atrophy with local estrogen cream and the possible effect on breast cancer of this treatment.  We also discussed the benefit of vulvar biopsy if pain or irritation of the vulva is chronic despite therapies.  Lastly, I introduced the dx of vulvodynia and potential treatments for this using local lidocaine and compounded pain relieving medications.    An After Visit Summary was printed and given  to the  patient.  __15____ minutes face to face time of which over 50% was spent in counseling.

## 2018-06-10 LAB — VAGINITIS/VAGINOSIS, DNA PROBE
CANDIDA SPECIES: NEGATIVE
GARDNERELLA VAGINALIS: NEGATIVE
Trichomonas vaginosis: NEGATIVE

## 2018-06-11 ENCOUNTER — Ambulatory Visit (AMBULATORY_SURGERY_CENTER): Payer: Self-pay | Admitting: *Deleted

## 2018-06-11 VITALS — Ht 63.0 in | Wt 177.0 lb

## 2018-06-11 DIAGNOSIS — Z1211 Encounter for screening for malignant neoplasm of colon: Secondary | ICD-10-CM

## 2018-06-11 MED ORDER — PEG-KCL-NACL-NASULF-NA ASC-C 140 G PO SOLR: 1.0000 | ORAL | 0 refills | Status: DC

## 2018-06-11 NOTE — Progress Notes (Signed)
No egg or soy allergy known to patient  No issues with past sedation with any surgeries  or procedures, no intubation problems  No diet pills per patient No home 02 use per patient  No blood thinners per patient  Pt denies issues with constipation  No A fib or A flutter  EMMI video sent to pt's e mail . Patient declined.

## 2018-06-16 ENCOUNTER — Encounter: Payer: Self-pay | Admitting: Family Medicine

## 2018-06-21 NOTE — Progress Notes (Signed)
Brooke Drake is a 52 y.o. female is here for follow up.  History of Present Illness:   Brooke Drake, CMA acting as scribe for Dr. Helane Drake.   NGE:[id number] in office for follow up. She would like to have referral for MTHFR testing. She also wanted to see about having a bone density scan as well.   Both of her children have tested positive for MTHFR. She has taken herself off the Prozac and has been very "weepy" and she would like to make sure she is not positive for it as well. She discontinued because she did not feel she needed it any more.   Health Maintenance Due  Topic Date Due  . INFLUENZA VACCINE  06/11/2018   Depression screen [facility] 2/9 06/22/2018 02/06/2017 01/07/2017  Decreased Interest 0 0 0  Down, Depressed, Hopeless 0 0 0  PHQ - 2 Score 0 0 0  Altered sleeping 0 - -  Tired, decreased energy 0 - -  Change in appetite 0 - -  Feeling bad or failure about yourself  0 - -  Trouble concentrating 0 - -  Moving slowly or fidgety/restless 0 - -  Suicidal thoughts 0 - -  PHQ-9 Score 0 - -   PMHx, SurgHx, SocialHx, FamHx, Medications, and Allergies were reviewed in the Visit Navigator and updated as appropriate.   Patient Active Problem List   Diagnosis Date Noted  . Lumbar radiculopathy 05/05/2018  . Pain in joint of left shoulder 05/05/2018  . 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 Iliotibial band syndrome of left side 02/24/2017  . Strain of left tibialis anterior muscle 02/10/2017  . Contusion of left lower leg 02/10/2017  . Dysthymia 01/07/2017  . Gastroesophageal reflux disease without esophagitis 01/07/2017  . Chronic seasonal allergic rhinitis due to pollen 01/07/2017  . Obesity (BMI 30.0-34.9) 01/07/2017   Social History   Tobacco Use  . Smoking status: Former Games developer  . Smokeless tobacco: Never Used  Substance Use Topics  . Alcohol use: Yes    Alcohol/week: 3.0 - 4.0 standard drinks    Types: 3 - 4 Glasses of wine per week    Comment: weekly    . Drug use: No   Current Medications and Allergies:   .  fluticasone (FLONASE) 50 MCG/ACT nasal spray, Place into both nostrils daily., Disp: , Rfl:  .  Multiple Vitamin (MULTIVITAMIN) tablet, Take 1 tablet by mouth daily. Women's health multi vitamin/multi mineral, Disp: , Rfl:    Allergies  Allergen Reactions  . Hydromorphone Other (See Comments)    Elevated heart rate.    Review of Systems   Pertinent items are noted in the HPI. Otherwise, ROS is negative.  Vitals:   Vitals:   06/22/18 1301  BP: 120/72  Pulse: 72  Temp: 98.5 F (36.9 C)  TempSrc: Oral  SpO2: 96%  Weight: 177 lb (80.3 kg)  Height: 5\' 3"  (1.6 m)     Body mass index is 31.35 kg/m.  Physical Exam:   Physical Exam  Constitutional: She appears well-nourished.  HENT:  Head: Normocephalic and atraumatic.  Eyes: Pupils are equal, round, and reactive to light. EOM are normal.  Neck: Normal range of motion. Neck supple.  Cardiovascular: Normal rate, regular rhythm, normal heart sounds and intact distal pulses.  Pulmonary/Chest: Effort normal.  Abdominal: Soft.  Skin: Skin is warm.  Psychiatric: She has a normal mood and affect. Her behavior is normal.  Nursing note and vitals reviewed.  Results for orders  placed or performed in visit on 06/09/18  Vaginitis/Vaginosis, DNA Probe  Result Value Ref Range   Candida Species Negative Negative   Gardnerella vaginalis Negative Negative   Trichomonas vaginosis Negative Negative    Assessment and Plan:   [name] was seen today for follow-up.  Diagnoses and all orders for this visit:  Estrogen deficiency -     DG Bone Density; Future  Family history of MTHFR deficiency -     MTHFR DNA Analysis   . Reviewed expectations re: course of current medical issues. . Discussed self-management of symptoms. . Outlined signs and symptoms indicating need for more acute intervention. . Patient verbalized understanding and all questions were answered. Brooke Drake. Health  Maintenance issues including appropriate healthy diet, exercise, and smoking avoidance were discussed with patient. . See orders for this visit as documented in the electronic medical record. . Patient received an After Visit Summary.  Brooke RimaErica Oluwatamilore Starnes, DO [place] 06/22/2018  Future Appointments  Date Time Provider Department Center  07/06/2018  2:30 PM Napoleon FormNandigam, Kavitha V, MD [facility]  09/22/2018 11:20 AM Brooke RimaWallace, Chantrell Apsey, DO [facility]   CMA served as scribe during this visit. History, Physical, and Plan performed by medical provider. The above documentation has been reviewed and is accurate and complete. Brooke RimaErica Torri Langston, D.O.

## 2018-06-22 ENCOUNTER — Ambulatory Visit: Payer: BLUE CROSS/BLUE SHIELD | Admitting: Family Medicine

## 2018-06-22 ENCOUNTER — Encounter: Payer: Self-pay | Admitting: Family Medicine

## 2018-06-22 VITALS — BP 120/72 | HR 72 | Temp 98.5°F | Ht 63.0 in | Wt 177.0 lb

## 2018-06-22 DIAGNOSIS — E2839 Other primary ovarian failure: Secondary | ICD-10-CM

## 2018-06-22 DIAGNOSIS — Z8349 Family history of other endocrine, nutritional and metabolic diseases: Secondary | ICD-10-CM

## 2018-06-25 LAB — MTHFR DNA ANALYSIS

## 2018-06-26 DIAGNOSIS — M25512 Pain in left shoulder: Secondary | ICD-10-CM | POA: Insufficient documentation

## 2018-07-06 ENCOUNTER — Encounter: Payer: BLUE CROSS/BLUE SHIELD | Admitting: Gastroenterology

## 2018-07-15 ENCOUNTER — Ambulatory Visit: Payer: BLUE CROSS/BLUE SHIELD | Admitting: Physician Assistant

## 2018-07-15 ENCOUNTER — Encounter: Payer: Self-pay | Admitting: Physician Assistant

## 2018-07-15 VITALS — BP 118/82 | HR 66 | Temp 98.0°F | Wt 173.6 lb

## 2018-07-15 DIAGNOSIS — H6982 Other specified disorders of Eustachian tube, left ear: Secondary | ICD-10-CM | POA: Diagnosis not present

## 2018-07-15 NOTE — Patient Instructions (Addendum)
It was great to see you!  Start Sudafed and take as directed x 5 days. Follow package instructions (depending on the strength, some you should take more frequently than others)  Let us know if your symptoms are not improving by the beginning of next week.  Take care,  Jarold Motto PA-C   Eustachian Tube Dysfunction The eustachian tube connects the middle ear to the back of the nose. It regulates air pressure in the middle ear by allowing air to move between the ear and nose. It also helps to drain fluid from the middle ear space. When the eustachian tube does not function properly, air pressure, fluid, or both can build up in the middle ear. Eustachian tube dysfunction can affect one or both ears. What are the causes? This condition happens when the eustachian tube becomes blocked or cannot open normally. This may result from:  Ear infections.  Colds and other upper respiratory infections.  Allergies.  Irritation, such as from cigarette smoke or acid from the stomach coming up into the esophagus (gastroesophageal reflux).  Sudden changes in air pressure, such as from descending in an airplane.  Abnormal growths in the nose or throat, such as nasal polyps, tumors, or enlarged tissue at the back of the throat (adenoids).  What increases the risk? This condition may be more likely to develop in people who smoke and people who are overweight. Eustachian tube dysfunction may also be more likely to develop in children, especially children who have:  Certain birth defects of the mouth, such as cleft palate.  Large tonsils and adenoids.  What are the signs or symptoms? Symptoms of this condition may include:  A feeling of fullness in the ear.  Ear pain.  Clicking or popping noises in the ear.  Ringing in the ear.  Hearing loss.  Loss of balance.  Symptoms may get worse when the air pressure around you changes, such as when you travel to an area of high elevation or fly  on an airplane. How is this diagnosed? This condition may be diagnosed based on:  Your symptoms.  A physical exam of your ear, nose, and throat.  Tests, such as those that measure: ? The movement of your eardrum (tympanogram). ? Your hearing (audiometry).  How is this treated? Treatment depends on the cause and severity of your condition. If your symptoms are mild, you may be able to relieve your symptoms by moving air into ("popping") your ears. If you have symptoms of fluid in your ears, treatment may include:  Decongestants.  Antihistamines.  Nasal sprays or ear drops that contain medicines that reduce swelling (steroids).  In some cases, you may need to have a procedure to drain the fluid in your eardrum (myringotomy). In this procedure, a small tube is placed in the eardrum to:  Drain the fluid.  Restore the air in the middle ear space.  Follow these instructions at home:  Take over-the-counter and prescription medicines only as told by your health care provider.  Use techniques to help pop your ears as recommended by your health care provider. These may include: ? Chewing gum. ? Yawning. ? Frequent, forceful swallowing. ? Closing your mouth, holding your nose closed, and gently blowing as if you are trying to blow air out of your nose.  Do not do any of the following until your health care provider approves: ? Travel to high altitudes. ? Fly in airplanes. ? Work in a Estate agent or room. ? Scuba dive.  Keep your ears dry. Dry your ears completely after showering or bathing.  Do not smoke.  Keep all follow-up visits as told by your health care provider. This is important. Contact a health care provider if:  Your symptoms do not go away after treatment.  Your symptoms come back after treatment.  You are unable to pop your ears.  You have: ? A fever. ? Pain in your ear. ? Pain in your head or neck. ? Fluid draining from your ear.  Your hearing  suddenly changes.  You become very dizzy.  You lose your balance. This information is not intended to replace advice given to you by your health care provider. Make sure you discuss any questions you have with your health care provider. Document Released: 11/24/2015 Document Revised: 04/04/2016 Document Reviewed: 11/16/2014 Elsevier Interactive Patient Education  [employer].

## 2018-07-15 NOTE — Progress Notes (Signed)
Brooke Drake is a 52 y.o. female here for a new problem.  History of Present Illness:   Chief Complaint  Patient presents with  . Ear Pain    HPI   About 2 to 3 days ago patient experienced some intermittent pressure in her left ear.  She is very meticulous about cleaning her ears, use hydrogen peroxide without relief of symptoms.  She has required approximately 2-3 ear lavages in her entire lifetime.  She denies recent travel.  She denies fever, chills, cough, nasal congestion.  She does have a history of chronic allergic rhinitis, and uses Flonase 2 squirts daily.  She is not on any daily antihistamines.  She denies any significant changes in her hearing.  She denies unusual headaches, changes in balance.  Past Medical History:  Diagnosis Date  . Abnormal Pap smear of cervix   . Allergy   . 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 Depression    was on prozac but not for depression  . GERD (gastroesophageal reflux disease)   . MVP (mitral valve prolapse) 30 years ago     Social History   Socioeconomic History  . Marital status: Divorced    Spouse name: Not on file  . Number of children: Not on file  . Years of education: Not on file  . Highest education level: Not on file  Occupational History  . Not on file  Social Needs  . Financial resource strain: Not on file  . Food insecurity:    Worry: Not on file    Inability: Not on file  . Transportation needs:    Medical: Not on file    Non-medical: Not on file  Tobacco Use  . Smoking status: Former Games developer  . Smokeless tobacco: Never Used  Substance and Sexual Activity  . Alcohol use: Yes    Alcohol/week: 3.0 - 4.0 standard drinks    Types: 3 - 4 Glasses of wine per week    Comment: weekly  . Drug use: No  . Sexual activity: Not Currently    Partners: Male    Birth control/protection: Post-menopausal    Comment: has been with boyfriend, but not currently  Lifestyle  . Physical activity:     Days per week: Not on file    Minutes per session: Not on file  . Stress: Not on file  Relationships  . Social connections:    Talks on phone: Not on file    Gets together: Not on file    Attends religious service: Not on file    Active member of club or organization: Not on file    Attends meetings of clubs or organizations: Not on file    Relationship status: Not on file  . Intimate partner violence:    Fear of current or ex partner: Not on file    Emotionally abused: Not on file    Physically abused: Not on file    Forced sexual activity: Not on file  Other Topics Concern  . Not on file  Social History Narrative  . Not on file    Past Surgical History:  Procedure Laterality Date  . CHOLECYSTECTOMY    . COLPOSCOPY      Family History  Problem Relation Age of Onset  . Cancer Mother 53  . Mental illness Mother   . Cancer Father 68       Mets--pancreas/lung  . Heart disease Father   . Heart disease Maternal Grandmother   .  Stroke Maternal Grandfather   . Colon cancer Neg Hx   . Colon polyps Neg Hx   . Esophageal cancer Neg Hx   . Rectal cancer Neg Hx   . Stomach cancer Neg Hx     Allergies  Allergen Reactions  . Hydromorphone Other (See Comments)    Elevated heart rate.     Current Medications:   Current Outpatient Medications:  .  fluticasone (FLONASE) 50 MCG/ACT nasal spray, Place into both nostrils daily., Disp: , Rfl:  .  gabapentin (NEURONTIN) 100 MG capsule, TAKE 1 CAPSULE BY MOUTH THREE TIMES A DAY, Disp: , Rfl: 2 .  Multiple Vitamin (MULTIVITAMIN) tablet, Take 1 tablet by mouth daily. Women's health multi vitamin/multi mineral, Disp: , Rfl:    Review of Systems:   ROS  Negative unless otherwise specified per HPI.  Vitals:   Vitals:   07/15/18 0944  BP: 118/82  Pulse: 66  Temp: 98 F (36.7 C)  TempSrc: Oral  SpO2: 96%  Weight: 173 lb 9.6 oz (78.7 kg)     Body mass index is 30.75 kg/m.  Physical Exam:   Physical Exam   Constitutional: She appears well-developed. She is cooperative.  Non-toxic appearance. She does not have a sickly appearance. She does not appear ill. No distress.  HENT:  Head: Normocephalic and atraumatic.  Right Ear: Tympanic membrane, external ear and ear canal normal. Tympanic membrane is not erythematous, not retracted and not bulging.  Left Ear: Tympanic membrane, external ear and ear canal normal. Tympanic membrane is not erythematous, not retracted and not bulging.  Nose: Nose normal. Right sinus exhibits no maxillary sinus tenderness and no frontal sinus tenderness. Left sinus exhibits no maxillary sinus tenderness and no frontal sinus tenderness.  Mouth/Throat: Uvula is midline. No posterior oropharyngeal edema or posterior oropharyngeal erythema.  Trace fluid, clear behind left TM  Eyes: Conjunctivae and lids are normal.  Neck: Trachea normal.  Cardiovascular: Normal rate, regular rhythm, S1 normal, S2 normal and normal heart sounds.  Pulmonary/Chest: Effort normal and breath sounds normal. She has no decreased breath sounds. She has no wheezes. She has no rhonchi. She has no rales.  Lymphadenopathy:    She has no cervical adenopathy.  Neurological: She is alert.  Skin: Skin is warm, dry and intact.  Psychiatric: She has a normal mood and affect. Her speech is normal and behavior is normal.  Nursing note and vitals reviewed.   Assessment and Plan:    Brooke Drake was seen today for ear pain.  Diagnoses and all orders for this visit:  Dysfunction of left eustachian tube   No red flags on exam.  Continue Flonase.  I recommended Sudafed daily for 5-7 days.  If she develops any worsening symptoms or lack of improvement despite treatment, I recommended that she follow-up with [place].  May need ENT evaluation. Patient is agreeable to plan.  . Reviewed expectations re: course of current medical issues. . Discussed self-management of symptoms. . Outlined signs and symptoms indicating need  for more acute intervention. . Patient verbalized understanding and all questions were answered. . See orders for this visit as documented in the electronic medical record. . Patient received an After-Visit Summary.   Jarold Motto, PA-C

## 2018-07-16 ENCOUNTER — Ambulatory Visit (INDEPENDENT_AMBULATORY_CARE_PROVIDER_SITE_OTHER)
Admission: RE | Admit: 2018-07-16 | Discharge: 2018-07-16 | Disposition: A | Discharge status: Home / Self Care | Payer: BLUE CROSS/BLUE SHIELD | Source: Ambulatory Visit | Attending: Family Medicine | Admitting: Family Medicine

## 2018-07-16 DIAGNOSIS — E2839 Other primary ovarian failure: Secondary | ICD-10-CM | POA: Diagnosis not present

## 2018-07-19 ENCOUNTER — Encounter: Payer: Self-pay | Admitting: Family Medicine

## 2018-07-19 NOTE — Progress Notes (Deleted)
   Brooke Drake is a 52 y.o. female here for an acute visit.  History of Present Illness:   {CMA SCRIBE ATTESTATION}  HPI:   PMHx, SurgHx, SocialHx, Medications, and Allergies were reviewed in the Visit Navigator and updated as appropriate.  Current Medications:   Current Outpatient Medications:  .  fluticasone (FLONASE) 50 MCG/ACT nasal spray, Place into both nostrils daily., Disp: , Rfl:  .  gabapentin (NEURONTIN) 100 MG capsule, TAKE 1 CAPSULE BY MOUTH THREE TIMES A DAY, Disp: , Rfl: 2 .  Multiple Vitamin (MULTIVITAMIN) tablet, Take 1 tablet by mouth daily. Women's health multi vitamin/multi mineral, Disp: , Rfl:    Allergies  Allergen Reactions  . Hydromorphone Other (See Comments)    Elevated heart rate.    Review of Systems:   Pertinent items are noted in the HPI. Otherwise, ROS is negative.  Vitals:  There were no vitals filed for this visit.   There is no height or weight on file to calculate BMI.  Physical Exam:   Physical Exam  Results for orders placed or performed in visit on 06/22/18  MTHFR DNA Analysis  Result Value Ref Range   Methylenetetrahydrofolate Reductase (MTHFR),DNA see note (A)    Interpretation see note     Assessment and Plan:   There are no diagnoses linked to this encounter.  . Reviewed expectations re: course of current medical issues. . Discussed self-management of symptoms. . Outlined signs and symptoms indicating need for more acute intervention. . Patient verbalized understanding and all questions were answered. Marland Kitchen Health Maintenance issues including appropriate healthy diet, exercise, and smoking avoidance were discussed with patient. . See orders for this visit as documented in the electronic medical record. . Patient received an After Visit Summary.  *** CMA served as Neurosurgeon during this visit. History, Physical, and Plan performed by medical provider. The above documentation has been reviewed and is accurate and complete. Helane Rima, D.O.  Helane Rima, DO [place], [facility] 07/19/2018

## 2018-07-20 ENCOUNTER — Ambulatory Visit: Payer: BLUE CROSS/BLUE SHIELD | Admitting: Family Medicine

## 2018-07-20 NOTE — Progress Notes (Signed)
Brooke Drake is a 52 y.o. female is here for follow up.  History of Present Illness:   Britt Bottom CMA acting as scribe for Dr. Earlene Plater.  HPI: Patient come in today for an acute visit. Patient was seen last week by [facility]. Patient is still having ear fullness and  tickle in the back of throat. She has used Sudafed and Flonase that did not help the symptoms.   Health Maintenance Due  Topic Date Due  . INFLUENZA VACCINE  06/11/2018   Depression screen [facility] 2/9 06/22/2018 02/06/2017 01/07/2017  Decreased Interest 0 0 0  Down, Depressed, Hopeless 0 0 0  PHQ - 2 Score 0 0 0  Altered sleeping 0 - -  Tired, decreased energy 0 - -  Change in appetite 0 - -  Feeling bad or failure about yourself  0 - -  Trouble concentrating 0 - -  Moving slowly or fidgety/restless 0 - -  Suicidal thoughts 0 - -  PHQ-9 Score 0 - -   PMHx, SurgHx, SocialHx, FamHx, Medications, and Allergies were reviewed in the Visit Navigator and updated as appropriate.   Patient Active Problem List   Diagnosis Date Noted  . Left shoulder pain 06/26/2018  . Lumbar radiculopathy 05/05/2018  . Pain in joint of left shoulder 05/05/2018  . 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 Iliotibial band syndrome of left side 02/24/2017  . Strain of left tibialis anterior muscle 02/10/2017  . Contusion of left lower leg 02/10/2017  . Dysthymia 01/07/2017  . Gastroesophageal reflux disease without esophagitis 01/07/2017  . Chronic seasonal allergic rhinitis due to pollen 01/07/2017  . Obesity (BMI 30.0-34.9) 01/07/2017   Social History   Tobacco Use  . Smoking status: Former Games developer  . Smokeless tobacco: Never Used  Substance Use Topics  . Alcohol use: Yes    Alcohol/week: 3.0 - 4.0 standard drinks    Types: 3 - 4 Glasses of wine per week    Comment: weekly  . Drug use: No   Current Medications and Allergies:   .  fluticasone (FLONASE) 50 MCG/ACT nasal spray, Place into both nostrils daily., Disp: , Rfl:    .  gabapentin (NEURONTIN) 100 MG capsule, TAKE 1 CAPSULE BY MOUTH THREE TIMES A DAY, Disp: , Rfl: 2 .  Multiple Vitamin (MULTIVITAMIN) tablet, Take 1 tablet by mouth daily. Women's health multi vitamin/multi mineral, Disp: , Rfl:    Allergies  Allergen Reactions  . Hydromorphone Other (See Comments)    Elevated heart rate.    Review of Systems   Pertinent items are noted in the HPI. Otherwise, ROS is negative.  Vitals:   Vitals:   07/21/18 1102  BP: 110/74  Pulse: 75  Temp: 98 F (36.7 C)  TempSrc: Oral  SpO2: 98%  Weight: 176 lb 3.2 oz (79.9 kg)  Height: 5\' 3"  (1.6 m)     Body mass index is 31.21 kg/m.  Physical Exam:   Physical Exam  Constitutional: She appears well-nourished.  HENT:  Head: Normocephalic and atraumatic.  Left Ear: A middle ear effusion is present.  Eyes: Pupils are equal, round, and reactive to light. EOM are normal.  Neck: Normal range of motion. Neck supple.  Cardiovascular: Normal rate, regular rhythm, normal heart sounds and intact distal pulses.  Pulmonary/Chest: Effort normal.  Abdominal: Soft.  Skin: Skin is warm.  Psychiatric: She has a normal mood and affect. Her behavior is normal.  Nursing note and vitals reviewed.  Assessment and Plan:  Diagnoses and all orders for this visit:  Dysfunction of left eustachian tube Comments: Will be more aggressive with treatment. See below.  Orders: -     azithromycin (ZITHROMAX) 250 MG tablet; 2 po on day one, then one po daily until gone -     predniSONE (DELTASONE) 5 MG tablet; 6-5-4-3-2-1-off    . Reviewed expectations re: course of current medical issues. . Discussed self-management of symptoms. . Outlined signs and symptoms indicating need for more acute intervention. . Patient verbalized understanding and all questions were answered. Marland Kitchen Health Maintenance issues including appropriate healthy diet, exercise, and smoking avoidance were discussed with patient. . See orders for this  visit as documented in the electronic medical record. . Patient received an After Visit Summary.  CMA served as Neurosurgeon during this visit. History, Physical, and Plan performed by medical provider. The above documentation has been reviewed and is accurate and complete. Helane Rima, D.O.  Helane Rima, DO [place], [facility] 07/27/2018

## 2018-07-21 ENCOUNTER — Encounter: Payer: Self-pay | Admitting: Family Medicine

## 2018-07-21 ENCOUNTER — Ambulatory Visit: Payer: BLUE CROSS/BLUE SHIELD | Admitting: Family Medicine

## 2018-07-21 VITALS — BP 110/74 | HR 75 | Temp 98.0°F | Ht 63.0 in | Wt 176.2 lb

## 2018-07-21 DIAGNOSIS — H6982 Other specified disorders of Eustachian tube, left ear: Secondary | ICD-10-CM | POA: Diagnosis not present

## 2018-07-21 DIAGNOSIS — H6992 Unspecified Eustachian tube disorder, left ear: Secondary | ICD-10-CM

## 2018-07-21 MED ORDER — AZITHROMYCIN 250 MG PO TABS: ORAL_TABLET | ORAL | 0 refills | Status: DC

## 2018-07-21 MED ORDER — PREDNISONE 5 MG PO TABS
ORAL_TABLET | ORAL | 0 refills | Status: DC
Start: 2018-07-21 — End: 2018-09-22

## 2018-09-20 NOTE — Progress Notes (Signed)
Brooke Drake is a 52 y.o. female is here for follow up.  History of Present Illness:   HPI: See Assessment and Plan section for Problem Based Charting of issues discussed today. Patient in office for follow up. She was seen last for problems with ear fullness. No problems recently with ear.   There are no preventive care reminders to display for this patient. Depression screen [facility] 2/9 06/22/2018 02/06/2017 01/07/2017  Decreased Interest 0 0 0  Down, Depressed, Hopeless 0 0 0  PHQ - 2 Score 0 0 0  Altered sleeping 0 - -  Tired, decreased energy 0 - -  Change in appetite 0 - -  Feeling bad or failure about yourself  0 - -  Trouble concentrating 0 - -  Moving slowly or fidgety/restless 0 - -  Suicidal thoughts 0 - -  PHQ-9 Score 0 - -   PMHx, SurgHx, SocialHx, FamHx, Medications, and Allergies were reviewed in the Visit Navigator and updated as appropriate.   Patient Active Problem List   Diagnosis Date Noted  . Family history of MTHFR deficiency 09/22/2018  . Pure hypercholesterolemia 09/22/2018  . Dysfunction of left eustachian tube 09/22/2018  . Left shoulder pain 06/26/2018  . Lumbar radiculopathy 05/05/2018  . Pain in joint of left shoulder 05/05/2018  . 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 Iliotibial band syndrome of left side 02/24/2017  . Strain of left tibialis anterior muscle 02/10/2017  . Contusion of left lower leg 02/10/2017  . Dysthymia 01/07/2017  . Gastroesophageal reflux disease without esophagitis 01/07/2017  . Chronic seasonal allergic rhinitis due to pollen 01/07/2017  . Obesity (BMI 30.0-34.9) 01/07/2017   Social History   Tobacco Use  . Smoking status: Former Games developer  . Smokeless tobacco: Never Used  Substance Use Topics  . Alcohol use: Yes    Alcohol/week: 3.0 - 4.0 standard drinks    Types: 3 - 4 Glasses of wine per week    Comment: weekly  . Drug use: No   Current Medications and Allergies:   .  fluticasone (FLONASE) 50 MCG/ACT nasal  spray, Place into both nostrils daily., Disp: , Rfl:  .  Multiple Vitamin (MULTIVITAMIN) tablet, Take 1 tablet by mouth daily. Women's health multi vitamin/multi mineral, Disp: , Rfl:    Allergies  Allergen Reactions  . Hydromorphone Other (See Comments)    Elevated heart rate.    Review of Systems   Pertinent items are noted in the HPI. Otherwise, ROS is negative.  Vitals:   Vitals:   09/22/18 1121  BP: 118/80  Pulse: 75  Temp: 98.7 F (37.1 C)  TempSrc: Oral  SpO2: 97%  Weight: 180 lb 3.2 oz (81.7 kg)  Height: 5\' 3"  (1.6 m)     Body mass index is 31.92 kg/m.  Physical Exam:   Physical Exam  Constitutional: She appears well-nourished.  HENT:  Head: Normocephalic and atraumatic.  Eyes: Pupils are equal, round, and reactive to light. EOM are normal.  Neck: Normal range of motion. Neck supple.  Cardiovascular: Normal rate, regular rhythm, normal heart sounds and intact distal pulses.  Pulmonary/Chest: Effort normal.  Abdominal: Soft.  Skin: Skin is warm.  Psychiatric: She has a normal mood and affect. Her behavior is normal.  Nursing note and vitals reviewed.  Assessment and Plan:   Pure hypercholesterolemia Trying to exercise on a regular basis? Yes. Compliant with diet? Yes.  Lab Results  Component Value Date   CHOL 199 02/16/2018   HDL 59.60  02/16/2018   LDLCALC 121 (H) 02/16/2018   TRIG 88.0 02/16/2018   CHOLHDL 3 02/16/2018   Lab Results  Component Value Date   ALT 13 02/16/2018   AST 21 02/16/2018   ALKPHOS 79 02/16/2018   BILITOT 0.6 02/16/2018   The 10-year ASCVD risk score Denman George DC Jr., et al., 2013) is: 1.4%   Values used to calculate the score:     Age: 57 years     Sex: Female     Is Non-Hispanic African American: No     Diabetic: No     Tobacco smoker: No     Systolic Blood Pressure: 118 mmHg     Is BP treated: No     HDL Cholesterol: 59.6 mg/dL     Total Cholesterol: 199 mg/dL  Dyslipidemia under fair control.  Plan: 1.  Continue dietary measures. 2. Continue regular exercise, an average 40 minutes of moderate to vigorous-intensity aerobic activity 3 or 4 times per week. 3. Lipid-lowering medications: None indicated unless lipids or risk profile worsen in the future.  Dysfunction of left eustachian tube Intermittent. With insurance change, she will be transferring to [facility] and will find ENT in that system.  Obesity (BMI 30.0-34.9) The patient is asked to make an attempt to improve diet and exercise patterns to aid in medical management of this problem.   . Reviewed expectations re: course of current medical issues. . Discussed self-management of symptoms. . Outlined signs and symptoms indicating need for more acute intervention. . Patient verbalized understanding and all questions were answered. Marland Kitchen Health Maintenance issues including appropriate healthy diet, exercise, and smoking avoidance were discussed with patient. . See orders for this visit as documented in the electronic medical record. . Patient received an After Visit Summary.  Helane Rima, DO [place], [facility] [place] 09/24/2018  CMA served as Neurosurgeon during this visit. History, Physical, and Plan performed by medical provider. The above documentation has been reviewed and is accurate and complete. Helane Rima, D.O.

## 2018-09-22 ENCOUNTER — Ambulatory Visit (INDEPENDENT_AMBULATORY_CARE_PROVIDER_SITE_OTHER): Payer: BLUE CROSS/BLUE SHIELD | Admitting: Family Medicine

## 2018-09-22 ENCOUNTER — Encounter: Payer: Self-pay | Admitting: Family Medicine

## 2018-09-22 VITALS — BP 118/80 | HR 75 | Temp 98.7°F | Ht 63.0 in | Wt 180.2 lb

## 2018-09-22 DIAGNOSIS — E669 Obesity, unspecified: Secondary | ICD-10-CM | POA: Diagnosis not present

## 2018-09-22 DIAGNOSIS — E7800 Pure hypercholesterolemia, unspecified: Secondary | ICD-10-CM | POA: Diagnosis not present

## 2018-09-22 DIAGNOSIS — H6982 Other specified disorders of Eustachian tube, left ear: Secondary | ICD-10-CM

## 2018-09-22 DIAGNOSIS — Z8349 Family history of other endocrine, nutritional and metabolic diseases: Secondary | ICD-10-CM

## 2018-09-22 DIAGNOSIS — H6992 Unspecified Eustachian tube disorder, left ear: Secondary | ICD-10-CM

## 2018-09-22 DIAGNOSIS — E66811 Obesity, class 1: Secondary | ICD-10-CM

## 2018-09-24 ENCOUNTER — Encounter: Payer: Self-pay | Admitting: Family Medicine

## 2018-09-24 NOTE — Assessment & Plan Note (Signed)
Intermittent. With insurance change, [facility] be [facility] and will find ENT in that system.

## 2018-09-24 NOTE — Assessment & Plan Note (Signed)
The patient is asked to make an attempt to improve diet and exercise patterns to aid in medical management of this problem.  

## 2018-09-24 NOTE — Assessment & Plan Note (Addendum)
Trying to exercise on a regular basis? Yes. Compliant with diet? Yes.  Lab Results  Component Value Date   CHOL 199 02/16/2018   HDL 59.60 02/16/2018   LDLCALC 121 (H) 02/16/2018   TRIG 88.0 02/16/2018   CHOLHDL 3 02/16/2018   Lab Results  Component Value Date   ALT 13 02/16/2018   AST 21 02/16/2018   ALKPHOS 79 02/16/2018   BILITOT 0.6 02/16/2018   The 10-year ASCVD risk score Denman George(Goff DC Jr., et al., 2013) is: 1.4%   Values used to calculate the score:     Age: 52 years     Sex: Female     Is Non-Hispanic African American: No     Diabetic: No     Tobacco smoker: No     Systolic Blood Pressure: 118 mmHg     Is BP treated: No     HDL Cholesterol: 59.6 mg/dL     Total Cholesterol: 199 mg/dL  Dyslipidemia under fair control.  Plan: 1. Continue dietary measures. 2. Continue regular exercise, an average 40 minutes of moderate to vigorous-intensity aerobic activity 3 or 4 times per week. 3. Lipid-lowering medications: None indicated unless lipids or risk profile worsen in the future.

## 2019-08-04 ENCOUNTER — Inpatient Hospital Stay (HOSPITAL_COMMUNITY)
Admission: EM | Admit: 2019-08-04 | Discharge: 2019-08-06 | DRG: 189 | Disposition: A | Discharge status: Home / Self Care | Payer: BLUE CROSS/BLUE SHIELD | Attending: Internal Medicine | Admitting: Internal Medicine

## 2019-08-04 ENCOUNTER — Emergency Department (HOSPITAL_COMMUNITY): Payer: BLUE CROSS/BLUE SHIELD

## 2019-08-04 ENCOUNTER — Encounter (HOSPITAL_COMMUNITY): Payer: Self-pay | Admitting: Emergency Medicine

## 2019-08-04 ENCOUNTER — Other Ambulatory Visit: Payer: Self-pay

## 2019-08-04 DIAGNOSIS — R0902 Hypoxemia: Secondary | ICD-10-CM | POA: Diagnosis present

## 2019-08-04 DIAGNOSIS — E876 Hypokalemia: Secondary | ICD-10-CM | POA: Diagnosis present

## 2019-08-04 DIAGNOSIS — Z20828 Contact with and (suspected) exposure to other viral communicable diseases: Secondary | ICD-10-CM | POA: Diagnosis present

## 2019-08-04 DIAGNOSIS — I4581 Long QT syndrome: Secondary | ICD-10-CM | POA: Diagnosis present

## 2019-08-04 DIAGNOSIS — J339 Nasal polyp, unspecified: Secondary | ICD-10-CM | POA: Diagnosis present

## 2019-08-04 DIAGNOSIS — I341 Nonrheumatic mitral (valve) prolapse: Secondary | ICD-10-CM | POA: Diagnosis present

## 2019-08-04 DIAGNOSIS — Z7951 Long term (current) use of inhaled steroids: Secondary | ICD-10-CM

## 2019-08-04 DIAGNOSIS — J329 Chronic sinusitis, unspecified: Secondary | ICD-10-CM | POA: Diagnosis present

## 2019-08-04 DIAGNOSIS — J9601 Acute respiratory failure with hypoxia: Principal | ICD-10-CM | POA: Diagnosis present

## 2019-08-04 DIAGNOSIS — D72829 Elevated white blood cell count, unspecified: Secondary | ICD-10-CM | POA: Diagnosis present

## 2019-08-04 DIAGNOSIS — K219 Gastro-esophageal reflux disease without esophagitis: Secondary | ICD-10-CM | POA: Diagnosis present

## 2019-08-04 DIAGNOSIS — Z885 Allergy status to narcotic agent status: Secondary | ICD-10-CM

## 2019-08-04 DIAGNOSIS — Z79899 Other long term (current) drug therapy: Secondary | ICD-10-CM

## 2019-08-04 DIAGNOSIS — R0602 Shortness of breath: Secondary | ICD-10-CM | POA: Diagnosis not present

## 2019-08-04 DIAGNOSIS — N289 Disorder of kidney and ureter, unspecified: Secondary | ICD-10-CM | POA: Diagnosis present

## 2019-08-04 DIAGNOSIS — Z87891 Personal history of nicotine dependence: Secondary | ICD-10-CM

## 2019-08-04 HISTORY — DX: Nasal polyp, unspecified: J33.9

## 2019-08-04 LAB — CBC
HCT: 45.2 % (ref 36.0–46.0)
Hemoglobin: 14.7 g/dL (ref 12.0–15.0)
MCH: 30.1 pg (ref 26.0–34.0)
MCHC: 32.5 g/dL (ref 30.0–36.0)
MCV: 92.6 fL (ref 80.0–100.0)
Platelets: 328 10*3/uL (ref 150–400)
RBC: 4.88 MIL/uL (ref 3.87–5.11)
RDW: 13.2 % (ref 11.5–15.5)
WBC: 24.9 10*3/uL — ABNORMAL HIGH (ref 4.0–10.5)
nRBC: 0 % (ref 0.0–0.2)

## 2019-08-04 LAB — I-STAT BETA HCG BLOOD, ED (MC, WL, AP ONLY): I-stat hCG, quantitative: <5 m[IU]/mL (ref <5)

## 2019-08-04 MED ORDER — SODIUM CHLORIDE 0.9% FLUSH
3.0000 mL | Freq: Once | INTRAVENOUS | Status: AC
  Administered 2019-08-04: 3 mL via INTRAVENOUS

## 2019-08-04 NOTE — ED Triage Notes (Signed)
Pt BIB [facility] from home c/o palpitations, nausea, and SOB.   Pt has been on steroids, currently on 3rd course. Has developed night cough for past 3-4 months combined with palpitations and SOB. Tonight symptoms worsen including new development of nausea x1 and diarrhea x1.  Upon EMS arrival to home noted pt. 86% room air and wheezing. Given 5 of albuterol and 4 of zofran IV. With resolution of nausea and wheezing. Placed on 4L nasal cannula.  Denies CP, chills, and fevers.

## 2019-08-04 NOTE — ED Provider Notes (Signed)
[facility] : [facility] EMERGENCY DEPARTMENT Provider Note   CSN: [id number] Arrival date & time: 08/04/19  2259     History   Chief Complaint Chief Complaint  Patient presents with  . Palpitations    HPI Brooke Drake is a 53 y.o. female who presents with palpitations and shortness of breath.  Past medical history significant for allergies, GERD, nasal polyps.  She states that she has been dealing with nasal polyps for a long time and sees ENT for this problem.  She has been on several rounds of oral steroids which temporarily help.  The last time was about a month ago.  Her symptoms usually get worse at night when she lies down.  She will have coughing fits but will be able to calm herself down.  Tonight she had a severe coughing fit and developed a racing heart rate and shortness of breath.  She states that she tried her usual methods to calm herself down but could not.  She went to the bathroom started having "massive diarrhea" and then had an episode of vomiting.  Since her symptoms did not improve she asked her daughter to call EMS.  EMS noted that she was hypoxic and put her on oxygen which she does not wear at home.  She was given albuterol but it didn't help. She denies any recent fevers, syncope, lightheadedness, chest pain, abdominal pain, further nausea, swelling in the legs.  No recent surgery/travel/immobilization, hx of cancer, hemoptysis, prior DVT/PE, or hormone use. No sick contacts. She works from home. She still feels SOB. She is a former smoker (quit 25 yrs ago)  HPI  Past Medical History:  Diagnosis Date  . Abnormal Pap smear of cervix   . Allergy   . 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 Depression    was on prozac but not for depression  . GERD (gastroesophageal reflux disease)   . MVP (mitral valve prolapse) 30 years ago    Patient Active Problem List   Diagnosis Date Noted  . Family history of MTHFR deficiency 09/22/2018  .  Pure hypercholesterolemia 09/22/2018  . Dysfunction of left eustachian tube 09/22/2018  . Left shoulder pain 06/26/2018  . Lumbar radiculopathy 05/05/2018  . Pain in joint of left shoulder 05/05/2018  . 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 Iliotibial band syndrome of left side 02/24/2017  . Strain of left tibialis anterior muscle 02/10/2017  . Contusion of left lower leg 02/10/2017  . Dysthymia 01/07/2017  . Gastroesophageal reflux disease without esophagitis 01/07/2017  . Chronic seasonal allergic rhinitis due to pollen 01/07/2017  . Obesity (BMI 30.0-34.9) 01/07/2017    Past Surgical History:  Procedure Laterality Date  . CHOLECYSTECTOMY    . COLPOSCOPY       OB History    Gravida  2   Para  2   Term      Preterm      AB      Living  2     SAB      TAB      Ectopic      Multiple      Live Births               Home Medications    Prior to Admission medications   Medication Sig Start Date End Date Taking? Authorizing Provider  fluticasone (FLONASE) 50 MCG/ACT nasal spray Place into both nostrils daily.    [provider]  Multiple Vitamin (  MULTIVITAMIN) tablet Take 1 tablet by mouth daily. Women's health multi vitamin/multi mineral    [provider]    Family History Family History  Problem Relation Age of Onset  . Cancer Mother 73  . Mental illness Mother   . Cancer Father 85       Mets--pancreas/lung  . Heart disease Father   . Heart disease Maternal Grandmother   . Stroke Maternal Grandfather   . Colon cancer Neg Hx   . Colon polyps Neg Hx   . Esophageal cancer Neg Hx   . Rectal cancer Neg Hx   . Stomach cancer Neg Hx     Social History Social History   Tobacco Use  . Smoking status: Former Games developer  . Smokeless tobacco: Never Used  Substance Use Topics  . Alcohol use: Yes    Alcohol/week: 3.0 - 4.0 standard drinks    Types: 3 - 4 Glasses of wine per week    Comment: weekly  . Drug use: No      Allergies   Hydromorphone   Review of Systems Review of Systems  Constitutional: Negative for chills and fever.  HENT: Positive for congestion (nasal).   Respiratory: Positive for cough and shortness of breath. Negative for wheezing.   Cardiovascular: Positive for palpitations. Negative for chest pain and leg swelling.  Gastrointestinal: Positive for diarrhea and vomiting. Negative for abdominal pain and nausea.       +gerd symptoms  Neurological: Negative for syncope and light-headedness.  All other systems reviewed and are negative.    Physical Exam Updated Vital Signs BP 112/82   Pulse (!) 102   Temp 98.3 F (36.8 C) (Oral)   Resp (!) 22   Ht 5\' 4"  (1.626 m)   Wt 81.6 kg   LMP 11/11/2013 (Approximate)   SpO2 94%   BMI 30.90 kg/m   Physical Exam Vitals signs and nursing note reviewed.  Constitutional:      General: She is not in acute distress.    Appearance: Normal appearance. She is well-developed. She is not ill-appearing.     Comments: Calm, cooperative. On 4L via [place]  HENT:     Head: Normocephalic and atraumatic.  Eyes:     General: No scleral icterus.       Right eye: No discharge.        Left eye: No discharge.     Conjunctiva/sclera: Conjunctivae normal.     Pupils: Pupils are equal, round, and reactive to light.  Neck:     Musculoskeletal: Normal range of motion.  Cardiovascular:     Rate and Rhythm: Tachycardia present.  Pulmonary:     Effort: Pulmonary effort is normal. No respiratory distress.     Breath sounds: Normal breath sounds.     Comments: Gets SOB with talking Abdominal:     General: There is no distension.     Palpations: Abdomen is soft.     Tenderness: There is no abdominal tenderness.  Musculoskeletal:     Right lower leg: No edema.     Left lower leg: No edema.  Skin:    General: Skin is warm and dry.  Neurological:     Mental Status: She is alert and oriented to person, place, and time.  Psychiatric:        Behavior:  Behavior normal.      ED Treatments / Results  Labs (all labs ordered are listed, but only abnormal results are displayed) Labs Reviewed  BASIC METABOLIC PANEL -  Abnormal; Notable for the following components:      Result Value   Potassium 3.1 (*)    Glucose, Bld 133 (*)    Creatinine, Ser 1.18 (*)    GFR calc non Af Amer 52 (*)    All other components within normal limits  CBC - Abnormal; Notable for the following components:   WBC 24.9 (*)    All other components within normal limits  D-DIMER, QUANTITATIVE (NOT AT [facility]) - Abnormal; Notable for the following components:   D-Dimer, Quant 2.14 (*)    All other components within normal limits  CULTURE, BLOOD (ROUTINE X 2)  CULTURE, BLOOD (ROUTINE X 2)  SARS CORONAVIRUS 2 (HOSPITAL ORDER, PERFORMED IN [facility])  LACTIC ACID, PLASMA  LACTIC ACID, PLASMA  URINALYSIS, ROUTINE W REFLEX MICROSCOPIC  I-STAT BETA HCG BLOOD, ED ([facility], [facility], [facility] ONLY)  TROPONIN I (HIGH SENSITIVITY)  TROPONIN I (HIGH SENSITIVITY)    EKG None  Radiology Dg Chest 2 View  Result Date: 08/04/2019 CLINICAL DATA:  Palpitations, shortness of breath EXAM: CHEST - 2 VIEW COMPARISON:  None. FINDINGS: Heart and mediastinal contours are within normal limits. No focal opacities or effusions. No acute bony abnormality. IMPRESSION: No active cardiopulmonary disease. Electronically Signed   By: Charlett NoseKevin  Dover M.D.   On: 08/04/2019 23:45   Ct Angio Chest Pe W/cm &/or Wo Cm  Result Date: 08/05/2019 CLINICAL DATA:  53 year old female with shortness of breath and palpitation. EXAM: CT ANGIOGRAPHY CHEST WITH CONTRAST TECHNIQUE: Multidetector CT imaging of the chest was performed using the standard protocol during bolus administration of intravenous contrast. Multiplanar CT image reconstructions and MIPs were obtained to evaluate the vascular anatomy. CONTRAST:  80mL OMNIPAQUE IOHEXOL 350 MG/ML SOLN COMPARISON:  Chest radiograph dated 08/04/2019 FINDINGS:  Cardiovascular: There is no cardiomegaly or pericardial effusion. The thoracic aorta is unremarkable. The visualized origins of the great vessels of the aortic arch appear patent. No pulmonary artery embolus identified. Mediastinum/Nodes: Top-normal right hilar lymph node measures 11 mm in short axis. The esophagus is grossly unremarkable. No mediastinal fluid collection. Lungs/Pleura: Minimal left lung base linear atelectasis/scarring. No focal consolidation, pleural effusion, or pneumothorax. The central airways are patent. Upper Abdomen: Cholecystectomy. The visualized upper abdomen is otherwise unremarkable. Musculoskeletal: No chest wall abnormality. No acute or significant osseous findings. Review of the MIP images confirms the above findings. IMPRESSION: No acute intrathoracic pathology. No CT evidence of pulmonary embolism. Electronically Signed   By: Elgie CollardArash  Radparvar M.D.   On: 08/05/2019 02:31    Procedures Procedures (including critical care time)  Medications Ordered in ED Medications  sodium chloride flush (NS) 0.9 % injection 3 mL (3 mLs Intravenous Given 08/04/19 2325)  sodium chloride 0.9 % bolus 1,000 mL (1,000 mLs Intravenous New Bag/Given 08/05/19 0058)  potassium chloride SA (K-DUR) CR tablet 40 mEq (40 mEq Oral Given 08/05/19 0056)  iohexol (OMNIPAQUE) 350 MG/ML injection 80 mL (80 mLs Intravenous Contrast Given 08/05/19 0146)     Initial Impression / Assessment and Plan / ED Course  I have reviewed the triage vital signs and the nursing notes.  Pertinent labs & imaging results that were available during my care of the patient were reviewed by me and considered in my medical decision making (see chart for details).  53 year old female presents with SOB, palpitations, vomiting and diarrhea since tonight. Some symptoms have been ongoing for months - vomiting/diarrhea and persistent SOB is new. She is requiring 4L of O2 and is at  92%. HR is mildly elevated ~114. Otherwise vitals are  normal. Exam is remarkable for mild tachycardia and clear lungs. Abdomen is soft, non-tender. There are no clinical signs of DVT. Pt does not report sick contacts, travel, or any other risk factor for COVID. Will obtain labs, EKG, CXR. Shared visit with Dr. Christy Gentles.  CBC is remarkable for marked leukocytosis of 24.9. BMP is remarkable for mild hypokalemia (3.1) and mildly elevated SCr. First and 2nd trop are normal. EKG is SR with prolonged QT. CXR is negative. D dimer is elevated to 2.14. Will obtain CTA chest.  CTA is negative. Unclear etiology of symptoms. Pt still has O2 requirement and feel palpitations. COVID test orderred. Will discuss with hospitalist.  Discussed with Dr. Myna Hidalgo who will admit.  Final Clinical Impressions(s) / ED Diagnoses   Final diagnoses:  Acute respiratory failure with hypoxia [facility])  Hypokalemia    ED Discharge Orders    None       Recardo Evangelist, PA-C 08/05/19 0341    Ripley Fraise, MD 08/05/19 [phone]

## 2019-08-05 ENCOUNTER — Emergency Department (HOSPITAL_COMMUNITY): Payer: BLUE CROSS/BLUE SHIELD

## 2019-08-05 ENCOUNTER — Encounter (HOSPITAL_COMMUNITY): Payer: Self-pay | Admitting: Radiology

## 2019-08-05 DIAGNOSIS — N289 Disorder of kidney and ureter, unspecified: Secondary | ICD-10-CM | POA: Diagnosis present

## 2019-08-05 DIAGNOSIS — D72829 Elevated white blood cell count, unspecified: Secondary | ICD-10-CM | POA: Diagnosis present

## 2019-08-05 DIAGNOSIS — K219 Gastro-esophageal reflux disease without esophagitis: Secondary | ICD-10-CM | POA: Diagnosis present

## 2019-08-05 DIAGNOSIS — J9601 Acute respiratory failure with hypoxia: Secondary | ICD-10-CM | POA: Diagnosis present

## 2019-08-05 DIAGNOSIS — Z79899 Other long term (current) drug therapy: Secondary | ICD-10-CM | POA: Diagnosis not present

## 2019-08-05 DIAGNOSIS — R0602 Shortness of breath: Secondary | ICD-10-CM | POA: Diagnosis present

## 2019-08-05 DIAGNOSIS — J339 Nasal polyp, unspecified: Secondary | ICD-10-CM | POA: Diagnosis present

## 2019-08-05 DIAGNOSIS — Z7951 Long term (current) use of inhaled steroids: Secondary | ICD-10-CM | POA: Diagnosis not present

## 2019-08-05 DIAGNOSIS — Z87891 Personal history of nicotine dependence: Secondary | ICD-10-CM | POA: Diagnosis not present

## 2019-08-05 DIAGNOSIS — I4581 Long QT syndrome: Secondary | ICD-10-CM | POA: Diagnosis present

## 2019-08-05 DIAGNOSIS — J9801 Acute bronchospasm: Secondary | ICD-10-CM | POA: Insufficient documentation

## 2019-08-05 DIAGNOSIS — J329 Chronic sinusitis, unspecified: Secondary | ICD-10-CM | POA: Diagnosis present

## 2019-08-05 DIAGNOSIS — R0902 Hypoxemia: Secondary | ICD-10-CM | POA: Diagnosis present

## 2019-08-05 DIAGNOSIS — Z885 Allergy status to narcotic agent status: Secondary | ICD-10-CM | POA: Diagnosis not present

## 2019-08-05 DIAGNOSIS — E876 Hypokalemia: Secondary | ICD-10-CM | POA: Diagnosis present

## 2019-08-05 DIAGNOSIS — I341 Nonrheumatic mitral (valve) prolapse: Secondary | ICD-10-CM | POA: Diagnosis present

## 2019-08-05 DIAGNOSIS — Z20828 Contact with and (suspected) exposure to other viral communicable diseases: Secondary | ICD-10-CM | POA: Diagnosis present

## 2019-08-05 LAB — CBC WITH DIFFERENTIAL/PLATELET
Abs Immature Granulocytes: 0.07 10*3/uL (ref 0.00–0.07)
Basophils Absolute: 0.1 10*3/uL (ref 0.0–0.1)
Basophils Relative: 1 %
Eosinophils Absolute: 0.3 10*3/uL (ref 0.0–0.5)
Eosinophils Relative: 2 %
HCT: 41.5 % (ref 36.0–46.0)
Hemoglobin: 13.5 g/dL (ref 12.0–15.0)
Immature Granulocytes: 1 %
Lymphocytes Relative: 15 %
Lymphs Abs: 2.2 10*3/uL (ref 0.7–4.0)
MCH: 30.7 pg (ref 26.0–34.0)
MCHC: 32.5 g/dL (ref 30.0–36.0)
MCV: 94.3 fL (ref 80.0–100.0)
Monocytes Absolute: 0.7 10*3/uL (ref 0.1–1.0)
Monocytes Relative: 5 %
Neutro Abs: 11.9 10*3/uL — ABNORMAL HIGH (ref 1.7–7.7)
Neutrophils Relative %: 76 %
Platelets: 310 10*3/uL (ref 150–400)
RBC: 4.4 MIL/uL (ref 3.87–5.11)
RDW: 13.4 % (ref 11.5–15.5)
WBC: 15.3 10*3/uL — ABNORMAL HIGH (ref 4.0–10.5)
nRBC: 0 % (ref 0.0–0.2)

## 2019-08-05 LAB — URINALYSIS, ROUTINE W REFLEX MICROSCOPIC
Bilirubin Urine: NEGATIVE
Glucose, UA: NEGATIVE mg/dL
Hgb urine dipstick: NEGATIVE
Ketones, ur: 20 mg/dL — AB
Nitrite: NEGATIVE
Protein, ur: 30 mg/dL — AB
Specific Gravity, Urine: >1.046 — ABNORMAL HIGH (ref 1.005–1.030)
pH: 5 (ref 5.0–8.0)

## 2019-08-05 LAB — BASIC METABOLIC PANEL
Anion gap: 14 (ref 5–15)
Anion gap: 10 (ref 5–15)
BUN: 12 mg/dL (ref 6–20)
BUN: 12 mg/dL (ref 6–20)
CO2: 23 mmol/L (ref 22–32)
CO2: 26 mmol/L (ref 22–32)
Calcium: 9.8 mg/dL (ref 8.9–10.3)
Calcium: 8.8 mg/dL — ABNORMAL LOW (ref 8.9–10.3)
Chloride: 103 mmol/L (ref 98–111)
Chloride: 104 mmol/L (ref 98–111)
Creatinine, Ser: 1.18 mg/dL — ABNORMAL HIGH (ref 0.44–1.00)
Creatinine, Ser: 0.92 mg/dL (ref 0.44–1.00)
GFR calc Af Amer: >60 mL/min (ref >60)
GFR calc Af Amer: >60 mL/min (ref >60)
GFR calc non Af Amer: 52 mL/min — ABNORMAL LOW (ref >60)
GFR calc non Af Amer: >60 mL/min (ref >60)
Glucose, Bld: 133 mg/dL — ABNORMAL HIGH (ref 70–99)
Glucose, Bld: 141 mg/dL — ABNORMAL HIGH (ref 70–99)
Potassium: 3.1 mmol/L — ABNORMAL LOW (ref 3.5–5.1)
Potassium: 4.6 mmol/L (ref 3.5–5.1)
Sodium: 140 mmol/L (ref 135–145)
Sodium: 140 mmol/L (ref 135–145)

## 2019-08-05 LAB — LACTIC ACID, PLASMA: Lactic Acid, Venous: 1.7 mmol/L (ref 0.5–1.9)

## 2019-08-05 LAB — SARS CORONAVIRUS 2 BY RT PCR (HOSPITAL ORDER, PERFORMED IN ~~LOC~~ HOSPITAL LAB): SARS Coronavirus 2: NEGATIVE

## 2019-08-05 LAB — D-DIMER, QUANTITATIVE: D-Dimer, Quant: 2.14 ug/mL-FEU — ABNORMAL HIGH (ref 0.00–0.50)

## 2019-08-05 LAB — TROPONIN I (HIGH SENSITIVITY)
Troponin I (High Sensitivity): 4 ng/L (ref <18)
Troponin I (High Sensitivity): 4 ng/L (ref <18)

## 2019-08-05 LAB — CREATININE, URINE, RANDOM: Creatinine, Urine: 164.69 mg/dL

## 2019-08-05 LAB — SODIUM, URINE, RANDOM: Sodium, Ur: 37 mmol/L

## 2019-08-05 MED ORDER — SODIUM CHLORIDE 0.9 % IV BOLUS
1000.0000 mL | Freq: Once | INTRAVENOUS | Status: AC
  Administered 2019-08-05: 1000 mL via INTRAVENOUS

## 2019-08-05 MED ORDER — POLYETHYLENE GLYCOL 3350 17 G PO PACK: 17.0000 g | PACK | Freq: Every day | ORAL | Status: DC | PRN

## 2019-08-05 MED ORDER — ALBUTEROL SULFATE (2.5 MG/3ML) 0.083% IN NEBU: 2.5000 mg | INHALATION_SOLUTION | RESPIRATORY_TRACT | Status: DC | PRN

## 2019-08-05 MED ORDER — ACETAMINOPHEN 325 MG PO TABS: 650.0000 mg | ORAL_TABLET | Freq: Four times a day (QID) | ORAL | Status: DC | PRN

## 2019-08-05 MED ORDER — ALUM & MAG HYDROXIDE-SIMETH 200-200-20 MG/5ML PO SUSP
30.0000 mL | Freq: Once | ORAL | Status: DC
  Filled 2019-08-05: qty 30

## 2019-08-05 MED ORDER — LIDOCAINE VISCOUS HCL 2 % MT SOLN
15.0000 mL | Freq: Once | OROMUCOSAL | Status: DC
  Filled 2019-08-05: qty 15

## 2019-08-05 MED ORDER — POTASSIUM CHLORIDE IN NACL 20-0.9 MEQ/L-% IV SOLN: INTRAVENOUS | Status: AC

## 2019-08-05 MED ORDER — POTASSIUM CHLORIDE CRYS ER 20 MEQ PO TBCR
40.0000 meq | EXTENDED_RELEASE_TABLET | Freq: Once | ORAL | Status: AC
  Administered 2019-08-05: 40 meq via ORAL
  Filled 2019-08-05: qty 2

## 2019-08-05 MED ORDER — ONDANSETRON HCL 4 MG/2ML IJ SOLN: 4.0000 mg | Freq: Four times a day (QID) | INTRAMUSCULAR | Status: DC | PRN

## 2019-08-05 MED ORDER — SODIUM CHLORIDE 0.9% FLUSH
3.0000 mL | Freq: Two times a day (BID) | INTRAVENOUS | Status: DC
  Administered 2019-08-05 – 2019-08-06 (×2): 3 mL via INTRAVENOUS

## 2019-08-05 MED ORDER — ONDANSETRON HCL 4 MG PO TABS: 4.0000 mg | ORAL_TABLET | Freq: Four times a day (QID) | ORAL | Status: DC | PRN

## 2019-08-05 MED ORDER — CALCIUM CARBONATE ANTACID 500 MG PO CHEW
1.0000 | CHEWABLE_TABLET | Freq: Three times a day (TID) | ORAL | Status: DC | PRN
  Administered 2019-08-05: 200 mg via ORAL
  Filled 2019-08-05: qty 1

## 2019-08-05 MED ORDER — IOHEXOL 350 MG/ML SOLN
80.0000 mL | Freq: Once | INTRAVENOUS | Status: AC | PRN
  Administered 2019-08-05: 02:00:00 80 mL via INTRAVENOUS

## 2019-08-05 MED ORDER — ENOXAPARIN SODIUM 40 MG/0.4ML ~~LOC~~ SOLN: 40.0000 mg | SUBCUTANEOUS | Status: DC

## 2019-08-05 MED ORDER — CALCIUM CARBONATE ANTACID 500 MG PO CHEW
1.0000 | CHEWABLE_TABLET | Freq: Once | ORAL | Status: AC
  Administered 2019-08-05: 03:00:00 200 mg via ORAL
  Filled 2019-08-05: qty 1

## 2019-08-05 MED ORDER — ACETAMINOPHEN 650 MG RE SUPP: 650.0000 mg | Freq: Four times a day (QID) | RECTAL | Status: DC | PRN

## 2019-08-05 NOTE — ED Notes (Signed)
Second lactic due at 4:32

## 2019-08-05 NOTE — ED Notes (Signed)
TELE 

## 2019-08-05 NOTE — ED Notes (Signed)
Patient transported to CT 

## 2019-08-05 NOTE — H&P (Signed)
History and Physical    Brooke Drake WUJ:[id number] DOB: October 02, 1966 DOA: 08/04/2019  PCP: Verlon Au, MD   Patient coming from: Home   Chief Complaint: SOB, cough   HPI: Brooke Drake is a 53 y.o. female with medical history significant for chronic rhinosinusitis, chronic cough, and mood disorder, now presenting to the emergency department for evaluation of shortness of breath.  Patient describes a long history of chronic cough, chronic sinusitis, and GERD.  She reports frequent coughing fits, mainly at night, which the patient attributes to GERD and states that these usually resolve quickly.  She had a coughing fit last night, became short of breath, and continued to feel dyspneic at rest and called EMS.  She reports taking H2 blocker and PPI in the past but she reports developing intolerable diarrhea with any GERD medication that she tried.  She denies any recent fevers or chills, denies any chest pain or palpitations, denies lower extremity swelling or tenderness, and denies any significant change in her chronic cough.  She is not aware of sick contacts.  Patient reports that she has been on steroids for nasal polyps.  EMS found her to be saturating 86% on room air, administered albuterol and Zofran, started supplemental oxygen, and brought her into the ED.  ED Course: Upon arrival to the ED, patient is found to be afebrile, saturating low 90s on 4 L/min of supplemental oxygen, slightly tachypneic, and with stable blood pressure.  EKG features of sinus rhythm and chest x-rays negative for acute cardiopulmonary disease.  CTA chest is negative for PE or other acute intrathoracic pathology.  Chemistry panel is notable for a creatinine of 1.18, up from 0.82 in January, and also notable for potassium 3.1.  CBC features a leukocytosis to 24,900.  Lactic acid is reassuringly normal, high-sensitivity troponin is normal x2, and d-dimer is elevated to 2.14.  Patient was given a liter of normal saline, 40  mEq of potassium, and blood cultures were collected in the ED.  Review of Systems:  All other systems reviewed and apart from HPI, are negative.  Past Medical History:  Diagnosis Date   Abnormal Pap smear of cervix    Allergy    Cervical high risk HPV (human papillomavirus) test positive 2019   Depression    was on prozac but not for depression   GERD (gastroesophageal reflux disease)    MVP (mitral valve prolapse) 30 years ago    Past Surgical History:  Procedure Laterality Date   CHOLECYSTECTOMY     COLPOSCOPY       reports that she has quit smoking. She has never used smokeless tobacco. She reports current alcohol use of about 3.0 - 4.0 standard drinks of alcohol per week. She reports that she does not use drugs.  Allergies  Allergen Reactions   Hydromorphone Other (See Comments)    Elevated heart rate.     Family History  Problem Relation Age of Onset   Cancer Mother 21   Mental illness Mother    Cancer Father 50       Mets--pancreas/lung   Heart disease Father    Heart disease Maternal Grandmother    Stroke Maternal Grandfather    Colon cancer Neg Hx    Colon polyps Neg Hx    Esophageal cancer Neg Hx    Rectal cancer Neg Hx    Stomach cancer Neg Hx      Prior to Admission medications   Medication Sig Start Date End Date Taking? Authorizing  Provider  fluticasone (FLONASE) 50 MCG/ACT nasal spray Place into both nostrils daily.    [provider]  Multiple Vitamin (MULTIVITAMIN) tablet Take 1 tablet by mouth daily. Women's health multi vitamin/multi mineral    [provider]    Physical Exam: Vitals:   08/05/19 0100 08/05/19 0130 08/05/19 0300 08/05/19 0330  BP: 112/78 119/82 125/89 118/83  Pulse: 93 93 92 88  Resp: 19 19 17  (!) 22  Temp:      TempSrc:      SpO2: 91% 92% 94% 91%  Weight:      Height:        Constitutional: NAD, calm  Eyes: PERTLA, lids and conjunctivae normal ENMT: Mucous membranes are moist.  Posterior pharynx clear of any exudate or lesions.   Neck: normal, supple, no masses, no thyromegaly Respiratory: no wheezing, no crackles. Normal respiratory effort. No accessory muscle use.  Cardiovascular: S1 & S2 heard, regular rate and rhythm. No extremity edema.   Abdomen: No distension, no tenderness, soft. Bowel sounds normal.  Musculoskeletal: no clubbing / cyanosis. No joint deformity upper and lower extremities.  Skin: no significant rashes, lesions, ulcers. Warm, dry, well-perfused. Neurologic: CN 2-12 grossly intact. Sensation intact. Strength 5/5 in all 4 limbs.  Psychiatric:  Alert and oriented to person, place, and situation. Calm, cooperative.     Labs on Admission: I have personally reviewed following labs and imaging studies  CBC: Recent Labs  Lab 08/04/19 2323  WBC 24.9*  HGB 14.7  HCT 45.2  MCV 92.6  PLT 328   Basic Metabolic Panel: Recent Labs  Lab 08/04/19 2323  NA 140  K 3.1*  CL 103  CO2 23  GLUCOSE 133*  BUN 12  CREATININE 1.18*  CALCIUM 9.8   GFR: Estimated Creatinine Clearance: 55.7 mL/min (A) (by C-G formula based on SCr of 1.18 mg/dL (H)). Liver Function Tests: No results for input(s): AST, ALT, ALKPHOS, BILITOT, PROT, ALBUMIN in the last 168 hours. No results for input(s): LIPASE, AMYLASE in the last 168 hours. No results for input(s): AMMONIA in the last 168 hours. Coagulation Profile: No results for input(s): INR, PROTIME in the last 168 hours. Cardiac Enzymes: No results for input(s): CKTOTAL, CKMB, CKMBINDEX, TROPONINI in the last 168 hours. BNP (last 3 results) No results for input(s): PROBNP in the last 8760 hours. HbA1C: No results for input(s): HGBA1C in the last 72 hours. CBG: No results for input(s): GLUCAP in the last 168 hours. Lipid Profile: No results for input(s): CHOL, HDL, LDLCALC, TRIG, CHOLHDL, LDLDIRECT in the last 72 hours. Thyroid Function Tests: No results for input(s): TSH, T4TOTAL, FREET4, T3FREE, THYROIDAB  in the last 72 hours. Anemia Panel: No results for input(s): VITAMINB12, FOLATE, FERRITIN, TIBC, IRON, RETICCTPCT in the last 72 hours. Urine analysis:    Component Value Date/Time   COLORURINE YELLOW 08/05/2019 0406   APPEARANCEUR HAZY (A) 08/05/2019 0406   LABSPEC >1.046 (H) 08/05/2019 0406   PHURINE 5.0 08/05/2019 0406   GLUCOSEU NEGATIVE 08/05/2019 0406   HGBUR NEGATIVE 08/05/2019 0406   BILIRUBINUR NEGATIVE 08/05/2019 0406   KETONESUR 20 (A) 08/05/2019 0406   PROTEINUR 30 (A) 08/05/2019 0406   NITRITE NEGATIVE 08/05/2019 0406   LEUKOCYTESUR LARGE (A) 08/05/2019 0406   Sepsis Labs: @LABRCNTIP (procalcitonin:4,lacticidven:4) ) Recent Results (from the past 240 hour(s))  SARS Coronavirus 2 [facility] order, Performed in [facility] hospital lab) Nasopharyngeal Nasopharyngeal Swab     Status: None   Collection Time: 08/05/19  2:56 AM   Specimen: Nasopharyngeal  Swab  Result Value Ref Range Status   SARS Coronavirus 2 NEGATIVE NEGATIVE Final    Comment: (NOTE) If result is NEGATIVE SARS-CoV-2 target nucleic acids are NOT DETECTED. The SARS-CoV-2 RNA is generally detectable in upper and lower  respiratory specimens during the acute phase of infection. The lowest  concentration of SARS-CoV-2 viral copies this assay can detect is 250  copies / mL. A negative result does not preclude SARS-CoV-2 infection  and should not be used as the sole basis for treatment or other  patient management decisions.  A negative result may occur with  improper specimen collection / handling, submission of specimen other  than nasopharyngeal swab, presence of viral mutation(s) within the  areas targeted by this assay, and inadequate number of viral copies  (<250 copies / mL). A negative result must be combined with clinical  observations, patient history, and epidemiological information. If result is POSITIVE SARS-CoV-2 target nucleic acids are DETECTED. The SARS-CoV-2 RNA is generally detectable in  upper and lower  respiratory specimens dur ing the acute phase of infection.  Positive  results are indicative of active infection with SARS-CoV-2.  Clinical  correlation with patient history and other diagnostic information is  necessary to determine patient infection status.  Positive results do  not rule out bacterial infection or co-infection with other viruses. If result is PRESUMPTIVE POSTIVE SARS-CoV-2 nucleic acids MAY BE PRESENT.   A presumptive positive result was obtained on the submitted specimen  and confirmed on repeat testing.  While 2019 novel coronavirus  (SARS-CoV-2) nucleic acids may be present in the submitted sample  additional confirmatory testing may be necessary for epidemiological  and / or clinical management purposes  to differentiate between  SARS-CoV-2 and other Sarbecovirus currently known to infect humans.  If clinically indicated additional testing with an alternate test  methodology [phone]) is advised. The SARS-CoV-2 RNA is generally  detectable in upper and lower respiratory sp ecimens during the acute  phase of infection. The expected result is Negative. Fact Sheet for Patients:  StrictlyIdeas.no Fact Sheet for Healthcare Providers: BankingDealers.co.za This test is not yet approved or cleared by the [place] FDA and has been authorized for detection and/or diagnosis of SARS-CoV-2 by FDA under an Emergency Use Authorization (EUA).  This EUA will remain in effect (meaning this test can be used) for the duration of the COVID-19 declaration under Section 564(b)(1) of the Act, 21 U.S.C. section 360bbb-3(b)(1), unless the authorization is terminated or revoked sooner. Performed at [facility], [place] [address], [place], [place] [address]      Radiological Exams on Admission: Dg Chest 2 View  Result Date: 08/04/2019 CLINICAL DATA:  Palpitations, shortness of breath EXAM: CHEST - 2 VIEW  COMPARISON:  None. FINDINGS: Heart and mediastinal contours are within normal limits. No focal opacities or effusions. No acute bony abnormality. IMPRESSION: No active cardiopulmonary disease. Electronically Signed   By: Rolm Baptise M.D.   On: 08/04/2019 23:45   Ct Angio Chest Pe W/cm &/or Wo Cm  Result Date: 08/05/2019 CLINICAL DATA:  53 year old female with shortness of breath and palpitation. EXAM: CT ANGIOGRAPHY CHEST WITH CONTRAST TECHNIQUE: Multidetector CT imaging of the chest was performed using the standard protocol during bolus administration of intravenous contrast. Multiplanar CT image reconstructions and MIPs were obtained to evaluate the vascular anatomy. CONTRAST:  24mL OMNIPAQUE IOHEXOL 350 MG/ML SOLN COMPARISON:  Chest radiograph dated 08/04/2019 FINDINGS: Cardiovascular: There is no cardiomegaly or pericardial effusion. The thoracic aorta is unremarkable.  The visualized origins of the great vessels of the aortic arch appear patent. No pulmonary artery embolus identified. Mediastinum/Nodes: Top-normal right hilar lymph node measures 11 mm in short axis. The esophagus is grossly unremarkable. No mediastinal fluid collection. Lungs/Pleura: Minimal left lung base linear atelectasis/scarring. No focal consolidation, pleural effusion, or pneumothorax. The central airways are patent. Upper Abdomen: Cholecystectomy. The visualized upper abdomen is otherwise unremarkable. Musculoskeletal: No chest wall abnormality. No acute or significant osseous findings. Review of the MIP images confirms the above findings. IMPRESSION: No acute intrathoracic pathology. No CT evidence of pulmonary embolism. Electronically Signed   By: Elgie CollardArash  Radparvar M.D.   On: 08/05/2019 02:31    EKG: Independently reviewed. Sinus rhythm, rate 100, QTc 448 ms.   Assessment/Plan   1. Acute hypoxic respiratory failure  - Presents with acute onset of SOB preceded by coughing fit, found to be saturating mid-80's on rm air and  was reportedly wheezing with EMS, treated with albuterol neb prior to arrival  - CTA chest negative for PE or other acute findings, COVID-19 testing is negative  - She is a former smoker, has chronic cough, was wheezing with EMS, and may have cough-variant asthma or COPD, she vomited pta and aspiration considered but she was dyspneic prior to vomiting  - Continue supplemental O2, continue as needed albuterol   2. Leukocytosis  - WBC is 24,900 in ED  - Blood cultures collected in ED  - Patient denies fevers and reports being on systemic steroid for nasal polyps  - Likely secondary to steroids, follow cultures and clinical course   3. Mild renal insufficiency  - SCr is 1.18 on admission, up from 0.82 in January 2020  - She was given a liter of NS in ED  - Check UA and urine chemistries, continue IVF hydration, repeat chem panel    4. GERD  - Patient reports intolerance of H2-blocker or PPI and is using dietary modifications     PPE: Mask, face shield. Patient wearing mask.  DVT prophylaxis: Lovenox  Code Status: Full  Family Communication: Discussed with patient  Consults called: None  Admission status: Observation    Briscoe Deutscherimothy S Jeffie Spivack, MD [facility] Pager [phone]  If 7PM-7AM, please contact night-coverage [email or web address] Password [email or web address]  08/05/2019, 5:08 AM

## 2019-08-05 NOTE — Progress Notes (Signed)
PROGRESS NOTE    Brooke Drake  STM:[id number] DOB: Nov 01, 1966 DOA: 08/04/2019 PCP: Bartholome Bill, MD   Brief Narrative:  Patient is a 53 year old female with history of chronic rhinosinusitis, chronic cough, mood disorder, nasal polyps who presents to the emerge department with complaints of shortness of breath.  She has long history of chronic cough, chronic sinusitis, GERD.  She denied any fever or chills on presentation.  She did not report  any sick contacts.  When she presented she was saturating on room air at 86%,she  was tachycardic .She was admitted for the management of respiratory insufficiency requiring oxygen supplementation for maintenance of saturation.  She had leukocytosis on presentation, mildly elevated d-dimer.  CT and chest x-rays did not show any pneumonia, pleural effusion or pulmonary embolism.  Assessment & Plan:   Principal Problem:   Acute respiratory failure with hypoxia (HCC) Active Problems:   Gastroesophageal reflux disease without esophagitis   Leukocytosis   Hypoxia   Acute hypoxic respiratory failure: Presented with shortness of breath which has recently progressed.  Saturating in the mid 80s on presentation.  She was also wheezing as per EMS.  She desaturates on ambulation.  Requires oxygen for maintenance of saturation.  Currently on 3 L of oxygen via nasal cannula.  We will try to taper the oxygen down and monitor her on room air if possible.  CTA chest negative for PE and other acute findings.  COVID-19 test negative. She is a former smoker.  Complains of chronic cough. She may have undiagnosed COPD or cough variant asthma.  She reports chronic nasal polyps and takes prednisone, follows with ENT. She needs albuterol inhaler on discharge. Continue bronchodilators as needed  Leukocytosis: Afebrile.  Hemodynamically stable.  Blood cultures have been sent will follow.  Denies fever or chills.  She reports taking prednisone at home.  Low suspicion for  infectious etiology  Mild renal insufficiency: Given a liter of normal saline at ED.  Will continue to monitor BMP  GERD: Stable.  Reports intolerance to H2 blocker, PPI.  On dietary modification.         DVT prophylaxis: Lovenox Code Status: Full Family Communication: None present at the bedside Disposition Plan: Home in 1 to 2 days.  She needs to be inpatient due to continuous requirement of oxygen supplementation for maintenance of saturation.   Consultants: None  Procedures: None  Antimicrobials:  Anti-infectives (From admission, onward)   None      Subjective: Patient seen and examined the bedside this morning.  Hemodynamically stable.  Feels little better but he still request liters of oxygen for maintenance of saturation.  Objective: Vitals:   08/05/19 1045 08/05/19 1131 08/05/19 1200 08/05/19 1300  BP: 127/79 112/68 116/83 121/84  Pulse: 75 80 68 68  Resp: (!) 21 18 (!) 21 20  Temp:      TempSrc:      SpO2: 95% 97% 96% 93%  Weight:      Height:        Intake/Output Summary (Last 24 hours) at 08/05/2019 1327 Last data filed at 08/05/2019 0300 Gross per 24 hour  Intake 1000 ml  Output -  Net 1000 ml   Filed Weights   08/04/19 2305  Weight: 81.6 kg    Examination:  General exam: Not in distress,average built HEENT:PERRL,Oral mucosa moist, Ear/Nose normal on gross exam Respiratory system: Bilateral decreased air entry Cardiovascular system: S1 & S2 heard, RRR. No JVD, murmurs, rubs, gallops or clicks. No pedal edema.  Gastrointestinal system: Abdomen is nondistended, soft and nontender. No organomegaly or masses felt. Normal bowel sounds heard. Central nervous system: Alert and oriented. No focal neurological deficits. Extremities: No edema, no clubbing ,no cyanosis, distal peripheral pulses palpable. Skin: No rashes, lesions or ulcers,no icterus ,no pallor MSK: Normal muscle bulk,tone ,power Psychiatry: Judgement and insight appear normal. Mood &  affect appropriate.     Data Reviewed: I have personally reviewed following labs and imaging studies  CBC: Recent Labs  Lab 08/04/19 2323 08/05/19 0855  WBC 24.9* 15.3*  NEUTROABS  --  11.9*  HGB 14.7 13.5  HCT 45.2 41.5  MCV 92.6 94.3  PLT 328 310   Basic Metabolic Panel: Recent Labs  Lab 08/04/19 2323 08/05/19 0855  NA 140 140  K 3.1* 4.6  CL 103 104  CO2 23 26  GLUCOSE 133* 141*  BUN 12 12  CREATININE 1.18* 0.92  CALCIUM 9.8 8.8*   GFR: Estimated Creatinine Clearance: 71.4 mL/min (by C-G formula based on SCr of 0.92 mg/dL). Liver Function Tests: No results for input(s): AST, ALT, ALKPHOS, BILITOT, PROT, ALBUMIN in the last 168 hours. No results for input(s): LIPASE, AMYLASE in the last 168 hours. No results for input(s): AMMONIA in the last 168 hours. Coagulation Profile: No results for input(s): INR, PROTIME in the last 168 hours. Cardiac Enzymes: No results for input(s): CKTOTAL, CKMB, CKMBINDEX, TROPONINI in the last 168 hours. BNP (last 3 results) No results for input(s): PROBNP in the last 8760 hours. HbA1C: No results for input(s): HGBA1C in the last 72 hours. CBG: No results for input(s): GLUCAP in the last 168 hours. Lipid Profile: No results for input(s): CHOL, HDL, LDLCALC, TRIG, CHOLHDL, LDLDIRECT in the last 72 hours. Thyroid Function Tests: No results for input(s): TSH, T4TOTAL, FREET4, T3FREE, THYROIDAB in the last 72 hours. Anemia Panel: No results for input(s): VITAMINB12, FOLATE, FERRITIN, TIBC, IRON, RETICCTPCT in the last 72 hours. Sepsis Labs: Recent Labs  Lab 08/05/19 0232  LATICACIDVEN 1.7    Recent Results (from the past 240 hour(s))  SARS Coronavirus 2 [facility](Hospital order, [facility]) Nasopharyngeal Nasopharyngeal Swab     Status: None   Collection Time: 08/05/19  2:56 AM   Specimen: Nasopharyngeal Swab  Result Value Ref Range Status   SARS Coronavirus 2 NEGATIVE NEGATIVE Final    Comment: (NOTE) If  result is NEGATIVE SARS-CoV-2 target nucleic acids are NOT DETECTED. The SARS-CoV-2 RNA is generally detectable in upper and lower  respiratory specimens during the acute phase of infection. The lowest  concentration of SARS-CoV-2 viral copies this assay can detect is 250  copies / mL. A negative result does not preclude SARS-CoV-2 infection  and should not be used as the sole basis for treatment or other  patient management decisions.  A negative result may occur with  improper specimen collection / handling, submission of specimen other  than nasopharyngeal swab, presence of viral mutation(s) within the  areas targeted by this assay, and inadequate number of viral copies  (<250 copies / mL). A negative result must be combined with clinical  observations, patient history, and epidemiological information. If result is POSITIVE SARS-CoV-2 target nucleic acids are DETECTED. The SARS-CoV-2 RNA is generally detectable in upper and lower  respiratory specimens dur ing the acute phase of infection.  Positive  results are indicative of active infection with SARS-CoV-2.  Clinical  correlation with patient history and other diagnostic information is  necessary to determine patient infection status.  Positive  results do  not rule out bacterial infection or co-infection with other viruses. If result is PRESUMPTIVE POSTIVE SARS-CoV-2 nucleic acids MAY BE PRESENT.   A presumptive positive result was obtained on the submitted specimen  and confirmed on repeat testing.  While 2019 novel coronavirus  (SARS-CoV-2) nucleic acids may be present in the submitted sample  additional confirmatory testing may be necessary for epidemiological  and / or clinical management purposes  to differentiate between  SARS-CoV-2 and other Sarbecovirus currently known to infect humans.  If clinically indicated additional testing with an alternate test  methodology [phone]) is advised. The SARS-CoV-2 RNA is generally   detectable in upper and lower respiratory sp ecimens during the acute  phase of infection. The expected result is Negative. Fact Sheet for Patients:  BoilerBrush.com.cy Fact Sheet for Healthcare Providers: https://pope.com/ This test is not yet approved or cleared by the [place] FDA and has been authorized for detection and/or diagnosis of SARS-CoV-2 by FDA under an Emergency Use Authorization (EUA).  This EUA will remain in effect (meaning this test can be used) for the duration of the COVID-19 declaration under Section 564(b)(1) of the Act, 21 U.S.C. section 360bbb-3(b)(1), unless the authorization is terminated or revoked sooner. Performed at [facility], [address], [place], [place] [address]          Radiology Studies: Dg Chest 2 View  Result Date: 08/04/2019 CLINICAL DATA:  Palpitations, shortness of breath EXAM: CHEST - 2 VIEW COMPARISON:  None. FINDINGS: Heart and mediastinal contours are within normal limits. No focal opacities or effusions. No acute bony abnormality. IMPRESSION: No active cardiopulmonary disease. Electronically Signed   By: Charlett Nose M.D.   On: 08/04/2019 23:45   Ct Angio Chest Pe W/cm &/or Wo Cm  Result Date: 08/05/2019 CLINICAL DATA:  53 year old female with shortness of breath and palpitation. EXAM: CT ANGIOGRAPHY CHEST WITH CONTRAST TECHNIQUE: Multidetector CT imaging of the chest was performed using the standard protocol during bolus administration of intravenous contrast. Multiplanar CT image reconstructions and MIPs were obtained to evaluate the vascular anatomy. CONTRAST:  15mL OMNIPAQUE IOHEXOL 350 MG/ML SOLN COMPARISON:  Chest radiograph dated 08/04/2019 FINDINGS: Cardiovascular: There is no cardiomegaly or pericardial effusion. The thoracic aorta is unremarkable. The visualized origins of the great vessels of the aortic arch appear patent. No pulmonary artery embolus identified.  Mediastinum/Nodes: Top-normal right hilar lymph node measures 11 mm in short axis. The esophagus is grossly unremarkable. No mediastinal fluid collection. Lungs/Pleura: Minimal left lung base linear atelectasis/scarring. No focal consolidation, pleural effusion, or pneumothorax. The central airways are patent. Upper Abdomen: Cholecystectomy. The visualized upper abdomen is otherwise unremarkable. Musculoskeletal: No chest wall abnormality. No acute or significant osseous findings. Review of the MIP images confirms the above findings. IMPRESSION: No acute intrathoracic pathology. No CT evidence of pulmonary embolism. Electronically Signed   By: Elgie Collard M.D.   On: 08/05/2019 02:31        Scheduled Meds: . enoxaparin (LOVENOX) injection  40 mg Subcutaneous Q24H  . sodium chloride flush  3 mL Intravenous Q12H   Continuous Infusions:   LOS: 0 days    Time spent: More than 50% of that time was spent in counseling and/or coordination of care.      Burnadette Pop, MD [facility] Pager [phone]  If 7PM-7AM, please contact night-coverage [email or web address] Password [email or web address] 08/05/2019, 1:27 PM

## 2019-08-05 NOTE — ED Notes (Signed)
Lunch Tray Ordered @ 1044.  

## 2019-08-05 NOTE — Progress Notes (Signed)
NEW ADMISSION NOTE New Admission Note:   Arrival Method: stretcher  Mental Orientation: alert and oriented  Telemetry: Assessment: Completed Skin: intact IV: intact Pain: Safety Measures: Safety Fall Prevention Plan has been given, discussed and signed Admission: Completed [facility] Orientation: Patient has been orientated to the room, unit and staff.  Family:  Orders have been reviewed and implemented. Will continue to monitor the patient. Call light has been placed within reach and bed alarm has been activated.   Shela Commons, RN

## 2019-08-05 NOTE — ED Provider Notes (Signed)
Patient seen/examined in the Emergency Department in conjunction with Advanced Practice Provider [facility] Patient reports shortness of breath and cough.  EMS gave patient oxygen/albuterol for wheezing Exam : awake/alert, lung sounds clear, pt requiring oxygen Plan: workup pending at this time   ED ECG REPORT   Date: 08/04/2019 2318  Rate: 100  Rhythm: sinus tachycardia  QRS Axis: normal  Intervals: QT prolonged  ST/T Wave abnormalities: nonspecific ST changes  Conduction Disutrbances:none  Narrative Interpretation:   Old EKG Reviewed: none available  I have personally reviewed the EKG tracing and agree with the computerized printout as noted.     Ripley Fraise, MD 08/05/19 [phone]

## 2019-08-05 NOTE — ED Notes (Signed)
Requisition for sodium urine and creatinine urine sent to lab. Per Lab to collected from urine sample sent for UA

## 2019-08-05 NOTE — ED Notes (Signed)
During hourly rounding, pt states she still feels the same like when she came in. She still feels SOB and palpitations. Denies CP/dizzness.   VS 94% on 4 L nasal cannula with RR 16 and HR 90

## 2019-08-05 NOTE — ED Notes (Signed)
Breakfast Ordered 

## 2019-08-05 NOTE — ED Notes (Signed)
Pt ambulated to restroom without O2. Returned, pt very SOB and O2 sats 88%

## 2019-08-06 LAB — CBC WITH DIFFERENTIAL/PLATELET
Abs Immature Granulocytes: 0.05 10*3/uL (ref 0.00–0.07)
Basophils Absolute: 0.1 10*3/uL (ref 0.0–0.1)
Basophils Relative: 1 %
Eosinophils Absolute: 2 10*3/uL — ABNORMAL HIGH (ref 0.0–0.5)
Eosinophils Relative: 16 %
HCT: 41.2 % (ref 36.0–46.0)
Hemoglobin: 12.9 g/dL (ref 12.0–15.0)
Immature Granulocytes: 0 %
Lymphocytes Relative: 25 %
Lymphs Abs: 3.1 10*3/uL (ref 0.7–4.0)
MCH: 29.4 pg (ref 26.0–34.0)
MCHC: 31.3 g/dL (ref 30.0–36.0)
MCV: 93.8 fL (ref 80.0–100.0)
Monocytes Absolute: 0.9 10*3/uL (ref 0.1–1.0)
Monocytes Relative: 7 %
Neutro Abs: 6.1 10*3/uL (ref 1.7–7.7)
Neutrophils Relative %: 51 %
Platelets: 292 10*3/uL (ref 150–400)
RBC: 4.39 MIL/uL (ref 3.87–5.11)
RDW: 13.5 % (ref 11.5–15.5)
WBC: 12.2 10*3/uL — ABNORMAL HIGH (ref 4.0–10.5)
nRBC: 0 % (ref 0.0–0.2)

## 2019-08-06 MED ORDER — ALBUTEROL SULFATE HFA 108 (90 BASE) MCG/ACT IN AERS: 2.0000 | INHALATION_SPRAY | Freq: Four times a day (QID) | RESPIRATORY_TRACT | 1 refills | Status: AC | PRN

## 2019-08-06 NOTE — Progress Notes (Signed)
Walked patient 50 feet in hallway on room air and o2 sat was 95%.

## 2019-08-06 NOTE — Progress Notes (Signed)
Brooke Drake to be discharged Home per MD order. Discussed prescriptions and follow up appointments with the patient. Prescriptions given to patient; medication list explained in detail. Patient verbalized understanding.  Skin clean, dry and intact without evidence of skin break down, no evidence of skin tears noted. IV catheter discontinued intact. Site without signs and symptoms of complications. Dressing and pressure applied. Pt denies pain at the site currently. No complaints noted.  Patient free of lines, drains, and wounds.   An After Visit Summary (AVS) was printed and given to the patient. Patient escorted via wheelchair, and discharged home via private auto.  Shela Commons, RN

## 2019-08-06 NOTE — Discharge Summary (Signed)
Physician Discharge Summary  Brooke Drake QIH:[id number] DOB: [birth year] DOA: 08/04/2019  PCP: Verlon Au, MD  Admit date: 08/04/2019 Discharge date: 08/06/2019  Admitted From: Home Disposition:  Home  Discharge Condition:Stable CODE STATUS:FULL Diet recommendation:  Regular    Brief/Interim Summary:  Patient is a 53 year old female with history of chronic rhinosinusitis, chronic cough, mood disorder, nasal polyps who presents to the emerge department with complaints of shortness of breath.  She has long history of chronic cough, chronic sinusitis, GERD.  She denied any fever or chills on presentation.  She did not report  any sick contacts.  When she presented she was saturating on room air at 86%,she  was tachycardic .She was admitted for the management of respiratory insufficiency requiring oxygen supplementation for maintenance of saturation.  She had leukocytosis on presentation, mildly elevated d-dimer.  CT and chest x-rays did not show any pneumonia, pleural effusion or pulmonary embolism.  She initially required oxygen supplementation for maintenance of saturation but currently she is on room air.  She is hemodynamically stable for discharge to home today.  Following problems were addressed during her hospitalization:  Acute hypoxic respiratory failure: Presented with shortness of breath which has recently progressed.  Saturating in the mid 80s on presentation.  She was also wheezing as per EMS.  She desaturates on ambulation.  Required  oxygen for maintenance of saturation.  CTA chest negative for PE and other acute findings.  COVID-19 test negative. She is a former smoker.  Complains of chronic cough. She may have undiagnosed COPD or cough variant asthma.  She reports chronic nasal polyps and takes prednisone, follows with ENT. She needs albuterol inhaler on discharge. Currently she is maintaining her saturation on room air at rest and on ambulation.  Leukocytosis:  Afebrile.  Hemodynamically stable.    Denies fever or chills.  She reports taking prednisone at home.  Low suspicion for infectious etiology  Mild renal insufficiency: resolved with fluid  GERD: Stable.  Reports intolerance to H2 blocker, PPI.  On dietary modification.     Discharge Diagnoses:  Principal Problem:   Acute respiratory failure with hypoxia (HCC) Active Problems:   Gastroesophageal reflux disease without esophagitis   Leukocytosis   Hypoxia    Discharge Instructions  Discharge Instructions    Diet - low sodium heart healthy   Complete by: As directed    Discharge instructions   Complete by: As directed    1)Please follow up with your PCP in a week. 2)Follow up with pulmonology as an outpatient for pulmonary function test. 3)Take prescribed medication as instructed.   Increase activity slowly   Complete by: As directed      Allergies as of 08/06/2019      Reactions   Hydromorphone Other (See Comments)   Elevated heart rate.       Medication List    TAKE these medications   albuterol 108 (90 Base) MCG/ACT inhaler Commonly known as: VENTOLIN HFA Inhale 2 puffs into the lungs every 6 (six) hours as needed for wheezing or shortness of breath.   Fish Oil 1200 MG Caps Take 1,200 mg by mouth daily.   FLUoxetine 10 MG capsule Commonly known as: PROZAC Take 10 mg by mouth daily.   fluticasone 50 MCG/ACT nasal spray Commonly known as: FLONASE Place 2 sprays into both nostrils daily.   ibuprofen 200 MG tablet Commonly known as: ADVIL Take 600 mg by mouth every 6 (six) hours as needed for moderate pain.   LICORICE ROOT  PO Take 500 mg by mouth daily.   multivitamin tablet Take 1 tablet by mouth daily. Women's health multi vitamin/multi mineral      Follow-up Information    Bartholome Bill, MD. Schedule an appointment as soon as possible for a visit in 1 week(s).   Specialty: Family Medicine Contact information: [place] [address] [phone]          Allergies  Allergen Reactions  . Hydromorphone Other (See Comments)    Elevated heart rate.     Consultations:  None   Procedures/Studies: Dg Chest 2 View  Result Date: 08/04/2019 CLINICAL DATA:  Palpitations, shortness of breath EXAM: CHEST - 2 VIEW COMPARISON:  None. FINDINGS: Heart and mediastinal contours are within normal limits. No focal opacities or effusions. No acute bony abnormality. IMPRESSION: No active cardiopulmonary disease. Electronically Signed   By: Rolm Baptise M.D.   On: 08/04/2019 23:45   Ct Angio Chest Pe W/cm &/or Wo Cm  Result Date: 08/05/2019 CLINICAL DATA:  53 year old female with shortness of breath and palpitation. EXAM: CT ANGIOGRAPHY CHEST WITH CONTRAST TECHNIQUE: Multidetector CT imaging of the chest was performed using the standard protocol during bolus administration of intravenous contrast. Multiplanar CT image reconstructions and MIPs were obtained to evaluate the vascular anatomy. CONTRAST:  44mL OMNIPAQUE IOHEXOL 350 MG/ML SOLN COMPARISON:  Chest radiograph dated 08/04/2019 FINDINGS: Cardiovascular: There is no cardiomegaly or pericardial effusion. The thoracic aorta is unremarkable. The visualized origins of the great vessels of the aortic arch appear patent. No pulmonary artery embolus identified. Mediastinum/Nodes: Top-normal right hilar lymph node measures 11 mm in short axis. The esophagus is grossly unremarkable. No mediastinal fluid collection. Lungs/Pleura: Minimal left lung base linear atelectasis/scarring. No focal consolidation, pleural effusion, or pneumothorax. The central airways are patent. Upper Abdomen: Cholecystectomy. The visualized upper abdomen is otherwise unremarkable. Musculoskeletal: No chest wall abnormality. No acute or significant osseous findings. Review of the MIP images confirms the above findings. IMPRESSION: No acute intrathoracic pathology. No CT evidence of  pulmonary embolism. Electronically Signed   By: Anner Crete M.D.   On: 08/05/2019 02:31       Subjective:  Patient seen and examined the bedside this morning.  Hemodynamically stable for discharge.  Discharge Exam: Vitals:   08/06/19 0543 08/06/19 0832  BP: 117/86 121/76  Pulse: 63 74  Resp: 18 18  Temp: 97.9 F (36.6 C) 98 F (36.7 C)  SpO2: 93% 94%   Vitals:   08/05/19 1607 08/05/19 2141 08/06/19 0543 08/06/19 0832  BP: 108/75 110/72 117/86 121/76  Pulse: 70 66 63 74  Resp: 18 19 18 18   Temp: 98.3 F (36.8 C) 98.1 F (36.7 C) 97.9 F (36.6 C) 98 F (36.7 C)  TempSrc: Oral Oral Oral Oral  SpO2: 97% 97% 93% 94%  Weight:  81.4 kg    Height:        General: Pt is alert, awake, not in acute distress Cardiovascular: RRR, S1/S2 +, no rubs, no gallops Respiratory: CTA bilaterally, no wheezing, no rhonchi Abdominal: Soft, NT, ND, bowel sounds + Extremities: no edema, no cyanosis    The results of significant diagnostics from this hospitalization (including imaging, microbiology, ancillary and laboratory) are listed below for reference.     Microbiology: Recent Results (from the past 240 hour(s))  SARS Coronavirus 2 [facility] order, Performed in [facility] hospital lab) Nasopharyngeal Nasopharyngeal Swab     Status: None   Collection Time: 08/05/19  2:56  AM   Specimen: Nasopharyngeal Swab  Result Value Ref Range Status   SARS Coronavirus 2 NEGATIVE NEGATIVE Final    Comment: (NOTE) If result is NEGATIVE SARS-CoV-2 target nucleic acids are NOT DETECTED. The SARS-CoV-2 RNA is generally detectable in upper and lower  respiratory specimens during the acute phase of infection. The lowest  concentration of SARS-CoV-2 viral copies this assay can detect is 250  copies / mL. A negative result does not preclude SARS-CoV-2 infection  and should not be used as the sole basis for treatment or other  patient management decisions.  A negative result may occur with   improper specimen collection / handling, submission of specimen other  than nasopharyngeal swab, presence of viral mutation(s) within the  areas targeted by this assay, and inadequate number of viral copies  (<250 copies / mL). A negative result must be combined with clinical  observations, patient history, and epidemiological information. If result is POSITIVE SARS-CoV-2 target nucleic acids are DETECTED. The SARS-CoV-2 RNA is generally detectable in upper and lower  respiratory specimens dur ing the acute phase of infection.  Positive  results are indicative of active infection with SARS-CoV-2.  Clinical  correlation with patient history and other diagnostic information is  necessary to determine patient infection status.  Positive results do  not rule out bacterial infection or co-infection with other viruses. If result is PRESUMPTIVE POSTIVE SARS-CoV-2 nucleic acids MAY BE PRESENT.   A presumptive positive result was obtained on the submitted specimen  and confirmed on repeat testing.  While 2019 novel coronavirus  (SARS-CoV-2) nucleic acids may be present in the submitted sample  additional confirmatory testing may be necessary for epidemiological  and / or clinical management purposes  to differentiate between  SARS-CoV-2 and other Sarbecovirus currently known to infect humans.  If clinically indicated additional testing with an alternate test  methodology [phone]) is advised. The SARS-CoV-2 RNA is generally  detectable in upper and lower respiratory sp ecimens during the acute  phase of infection. The expected result is Negative. Fact Sheet for Patients:  BoilerBrush.com.cy Fact Sheet for Healthcare Providers: https://pope.com/ This test is not yet approved or cleared by the [place] FDA and has been authorized for detection and/or diagnosis of SARS-CoV-2 by FDA under an Emergency Use Authorization (EUA).  This EUA will  remain in effect (meaning this test can be used) for the duration of the COVID-19 declaration under Section 564(b)(1) of the Act, 21 U.S.C. section 360bbb-3(b)(1), unless the authorization is terminated or revoked sooner. Performed at [facility], [address] [place], [place] [address]      Labs: BNP (last 3 results) No results for input(s): BNP in the last 8760 hours. Basic Metabolic Panel: Recent Labs  Lab 08/04/19 2323 08/05/19 0855  NA 140 140  K 3.1* 4.6  CL 103 104  CO2 23 26  GLUCOSE 133* 141*  BUN 12 12  CREATININE 1.18* 0.92  CALCIUM 9.8 8.8*   Liver Function Tests: No results for input(s): AST, ALT, ALKPHOS, BILITOT, PROT, ALBUMIN in the last 168 hours. No results for input(s): LIPASE, AMYLASE in the last 168 hours. No results for input(s): AMMONIA in the last 168 hours. CBC: Recent Labs  Lab 08/04/19 2323 08/05/19 0855 08/06/19 0518  WBC 24.9* 15.3* 12.2*  NEUTROABS  --  11.9* 6.1  HGB 14.7 13.5 12.9  HCT 45.2 41.5 41.2  MCV 92.6 94.3 93.8  PLT 328 310 292   Cardiac Enzymes: No results for input(s):  CKTOTAL, CKMB, CKMBINDEX, TROPONINI in the last 168 hours. BNP: Invalid input(s): POCBNP CBG: No results for input(s): GLUCAP in the last 168 hours. D-Dimer Recent Labs    08/05/19 0006  DDIMER 2.14*   Hgb A1c No results for input(s): HGBA1C in the last 72 hours. Lipid Profile No results for input(s): CHOL, HDL, LDLCALC, TRIG, CHOLHDL, LDLDIRECT in the last 72 hours. Thyroid function studies No results for input(s): TSH, T4TOTAL, T3FREE, THYROIDAB in the last 72 hours.  Invalid input(s): FREET3 Anemia work up No results for input(s): VITAMINB12, FOLATE, FERRITIN, TIBC, IRON, RETICCTPCT in the last 72 hours. Urinalysis    Component Value Date/Time   COLORURINE YELLOW 08/05/2019 0406   APPEARANCEUR HAZY (A) 08/05/2019 0406   LABSPEC >1.046 (H) 08/05/2019 0406   PHURINE 5.0 08/05/2019 0406   GLUCOSEU NEGATIVE 08/05/2019 0406    HGBUR NEGATIVE 08/05/2019 0406   BILIRUBINUR NEGATIVE 08/05/2019 0406   KETONESUR 20 (A) 08/05/2019 0406   PROTEINUR 30 (A) 08/05/2019 0406   NITRITE NEGATIVE 08/05/2019 0406   LEUKOCYTESUR LARGE (A) 08/05/2019 0406   Sepsis Labs Invalid input(s): PROCALCITONIN,  WBC,  LACTICIDVEN Microbiology Recent Results (from the past 240 hour(s))  SARS Coronavirus 2 [facility] order, Performed in [facility] hospital lab) Nasopharyngeal Nasopharyngeal Swab     Status: None   Collection Time: 08/05/19  2:56 AM   Specimen: Nasopharyngeal Swab  Result Value Ref Range Status   SARS Coronavirus 2 NEGATIVE NEGATIVE Final    Comment: (NOTE) If result is NEGATIVE SARS-CoV-2 target nucleic acids are NOT DETECTED. The SARS-CoV-2 RNA is generally detectable in upper and lower  respiratory specimens during the acute phase of infection. The lowest  concentration of SARS-CoV-2 viral copies this assay can detect is 250  copies / mL. A negative result does not preclude SARS-CoV-2 infection  and should not be used as the sole basis for treatment or other  patient management decisions.  A negative result may occur with  improper specimen collection / handling, submission of specimen other  than nasopharyngeal swab, presence of viral mutation(s) within the  areas targeted by this assay, and inadequate number of viral copies  (<250 copies / mL). A negative result must be combined with clinical  observations, patient history, and epidemiological information. If result is POSITIVE SARS-CoV-2 target nucleic acids are DETECTED. The SARS-CoV-2 RNA is generally detectable in upper and lower  respiratory specimens dur ing the acute phase of infection.  Positive  results are indicative of active infection with SARS-CoV-2.  Clinical  correlation with patient history and other diagnostic information is  necessary to determine patient infection status.  Positive results do  not rule out bacterial infection or  co-infection with other viruses. If result is PRESUMPTIVE POSTIVE SARS-CoV-2 nucleic acids MAY BE PRESENT.   A presumptive positive result was obtained on the submitted specimen  and confirmed on repeat testing.  While 2019 novel coronavirus  (SARS-CoV-2) nucleic acids may be present in the submitted sample  additional confirmatory testing may be necessary for epidemiological  and / or clinical management purposes  to differentiate between  SARS-CoV-2 and other Sarbecovirus currently known to infect humans.  If clinically indicated additional testing with an alternate test  methodology [phone]) is advised. The SARS-CoV-2 RNA is generally  detectable in upper and lower respiratory sp ecimens during the acute  phase of infection. The expected result is Negative. Fact Sheet for Patients:  BoilerBrush.com.cy Fact Sheet for Healthcare Providers: https://pope.com/ This test is not yet approved or cleared  by the [place] FDA and has been authorized for detection and/or diagnosis of SARS-CoV-2 by FDA under an Emergency Use Authorization (EUA).  This EUA will remain in effect (meaning this test can be used) for the duration of the COVID-19 declaration under Section 564(b)(1) of the Act, 21 U.S.C. section 360bbb-3(b)(1), unless the authorization is terminated or revoked sooner. Performed at [facility], [address] [place], [place] [address]     Please note: You were cared for by a hospitalist during your hospital stay. Once you are discharged, your primary care physician will handle any further medical issues. Please note that NO REFILLS for any discharge medications will be authorized once you are discharged, as it is imperative that you return to your primary care physician (or establish a relationship with a primary care physician if you do not have one) for your post hospital discharge needs so that they can reassess your need  for medications and monitor your lab values.    Time coordinating discharge: 40 minutes  SIGNED:   Burnadette PopAmrit Damarrion Mimbs, MD  [facility] 08/06/2019, 10:30 AM Pager [phone]  If 7PM-7AM, please contact night-coverage [email or web address] Password [email or web address]

## 2019-08-10 LAB — CULTURE, BLOOD (ROUTINE X 2)
Culture: NO GROWTH
Culture: NO GROWTH
Special Requests: ADEQUATE

## 2019-10-05 ENCOUNTER — Telehealth: Payer: Self-pay

## 2019-10-05 NOTE — Telephone Encounter (Signed)
Patient is in 08 recall for AEX with pap smear. Tried calling patient. No answer, left message for patient to call me back at [phone].

## 2019-10-05 NOTE — Telephone Encounter (Signed)
Patient is returning call to [facility]. Patient stated that she is now seeing a different provider due to insurance change. Patient stated that she would call and follow up with them.

## 2019-11-17 ENCOUNTER — Telehealth: Payer: Self-pay | Admitting: Obstetrics and Gynecology

## 2019-11-17 NOTE — Telephone Encounter (Signed)
Routed to Dr Edward Jolly for [facility]. Encounter closed.

## 2019-11-17 NOTE — Telephone Encounter (Signed)
Pt removed from pap recall. Will close addendum.

## 2019-11-17 NOTE — Telephone Encounter (Signed)
Call to patient to schedule AEX with Dr. Edward Jolly. Patient in 08 recall. Patient stated that our office is no longer in network with her insurance and asked to be removed from the list. Patient advised that she is due for an exam. Patient stated she has not established care with a new provider.

## 2019-11-17 NOTE — Telephone Encounter (Signed)
Please remove patient from pap recall.

## 2020-03-15 IMAGING — CT CT ANGIO CHEST
2 of 7 series · 18 of 46 positions shown · IV contrast (APPLIED)
Comparison: Chest radiograph dated 08/04/2019

CLINICAL DATA: 53-year-old female with shortness of breath and
palpitation.

EXAM:
CT ANGIOGRAPHY CHEST WITH CONTRAST
TECHNIQUE: Multidetector CT imaging of the chest was performed using the
standard protocol during bolus administration of intravenous
contrast. Multiplanar CT image reconstructions and MIPs were
obtained to evaluate the vascular anatomy.
CONTRAST:  80mL OMNIPAQUE IOHEXOL 350 MG/ML SOLN

[Series 7: thins · axial · 0.83mm/px · z∈[-174,+103]mm · 15 of 446 slices shown]
[im 25/446  lung]
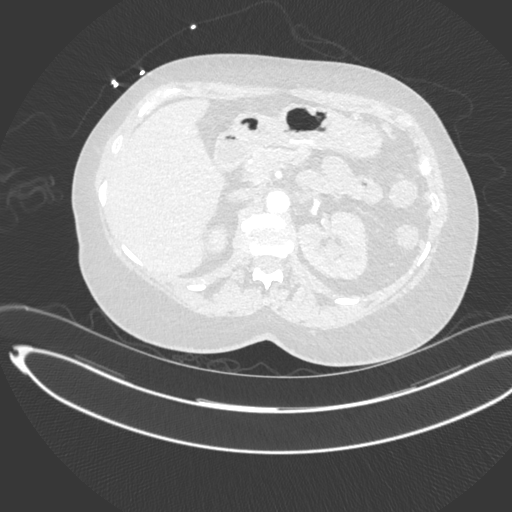
[im 50/446  soft-tissue]
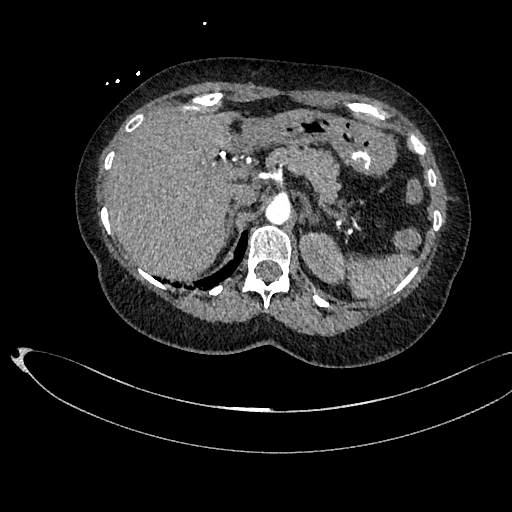
[im 75/446  lung]
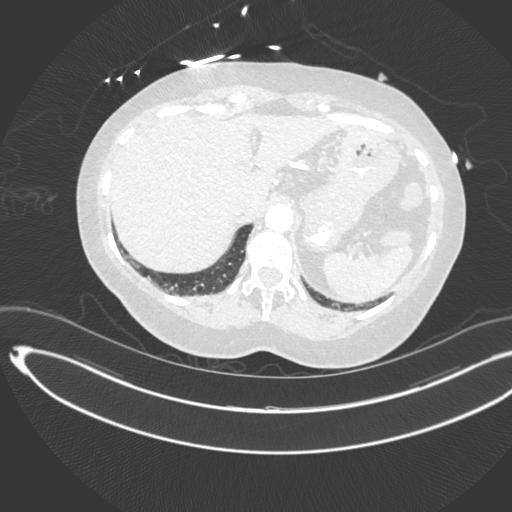
[im 99/446  soft-tissue]
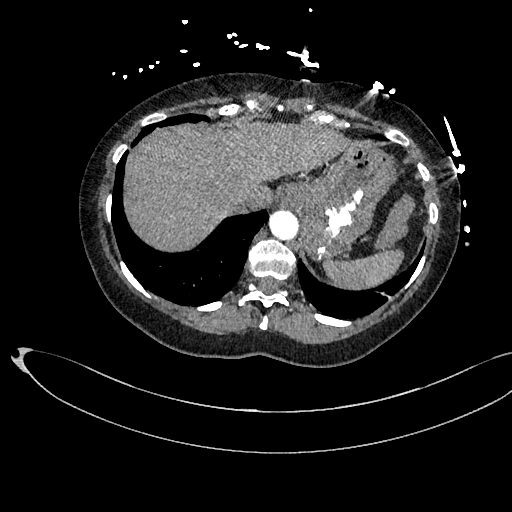
[im 149/446  lung]
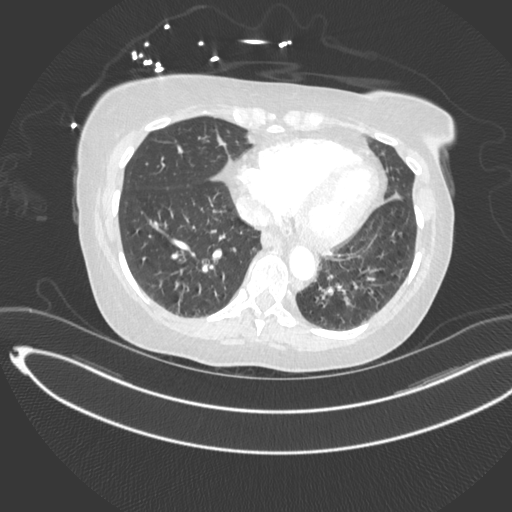
[im 174/446  soft-tissue]
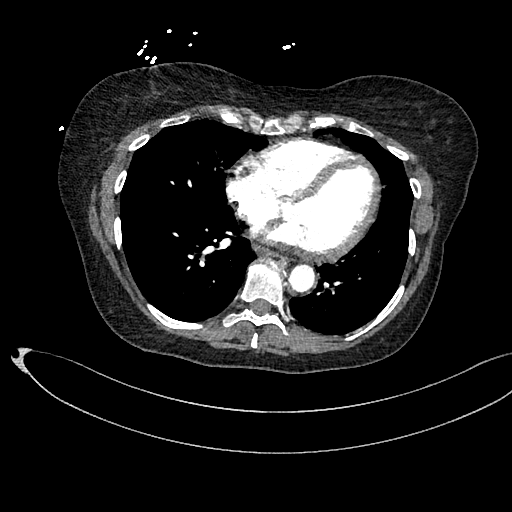
[im 198/446  lung]
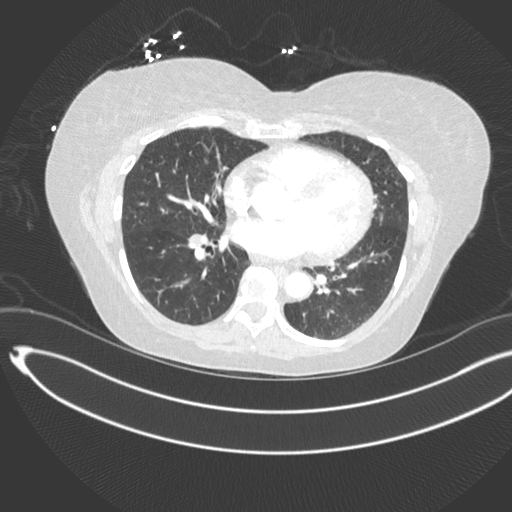
[im 223/446  soft-tissue]
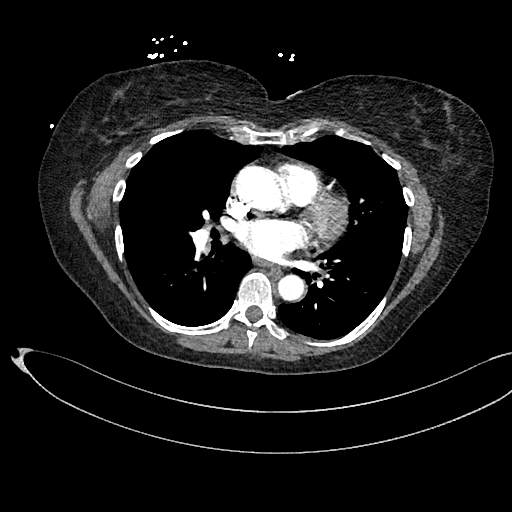
[im 248/446  lung]
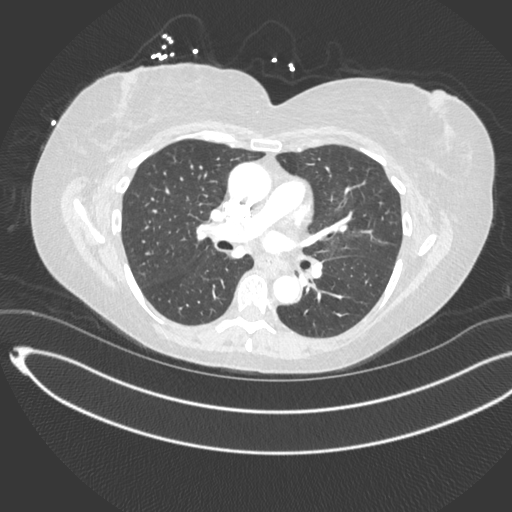
[im 272/446  soft-tissue]
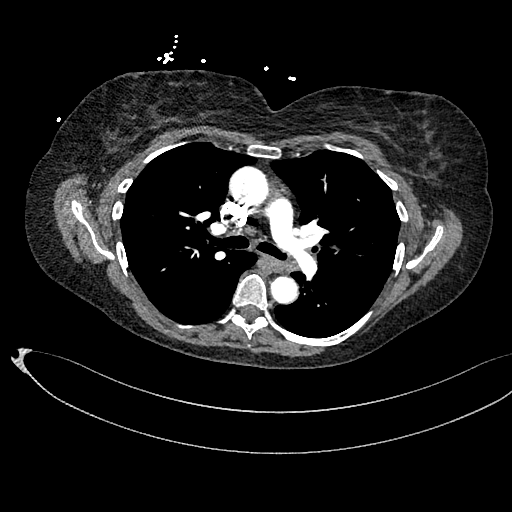
[im 297/446  lung]
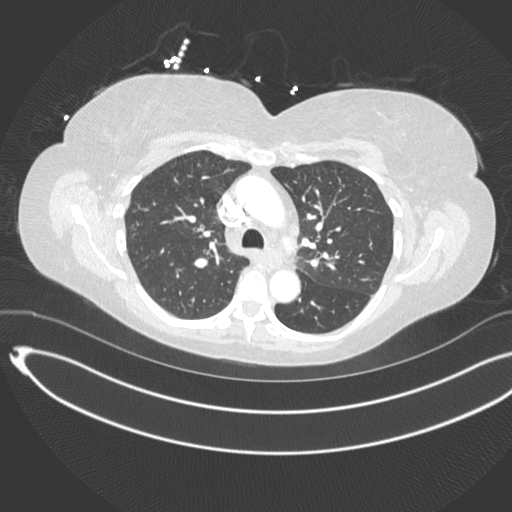
[im 347/446  soft-tissue]
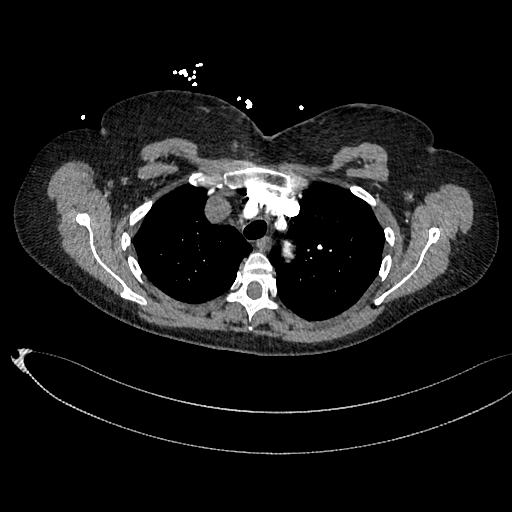
[im 371/446  lung]
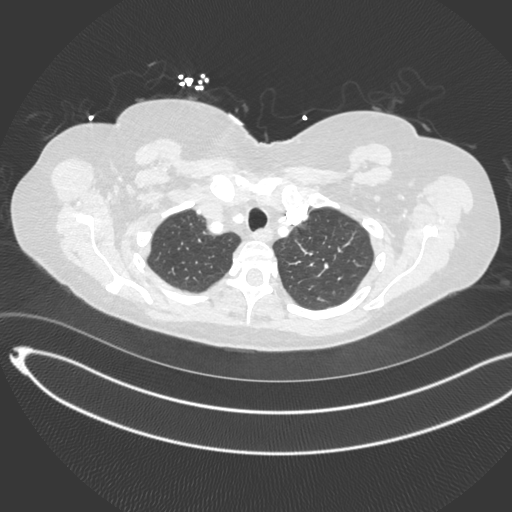
[im 396/446  soft-tissue]
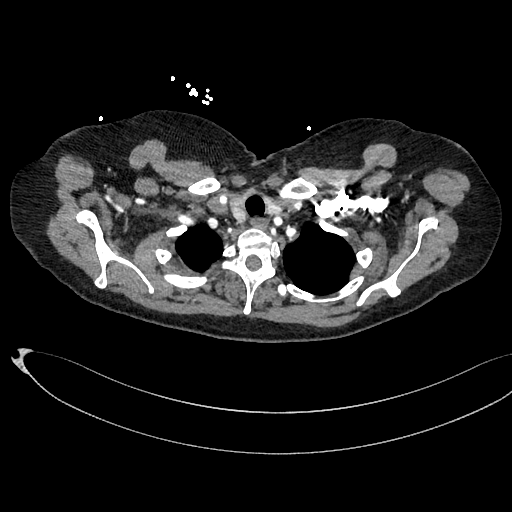
[im 421/446  lung]
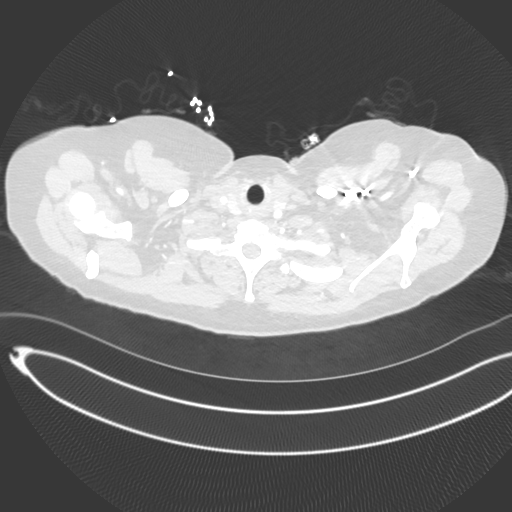

[Series 8: cor · coronal · 0.63mm/px · 3 of 137 slices shown]
[im 35/137  soft-tissue]
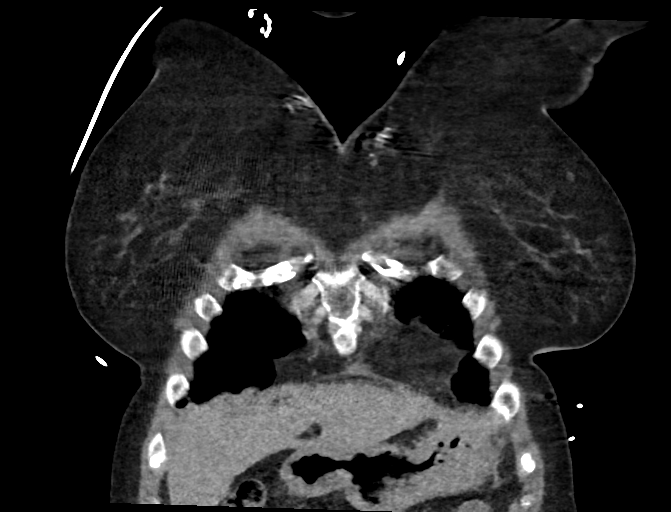
[im 69/137  soft-tissue]
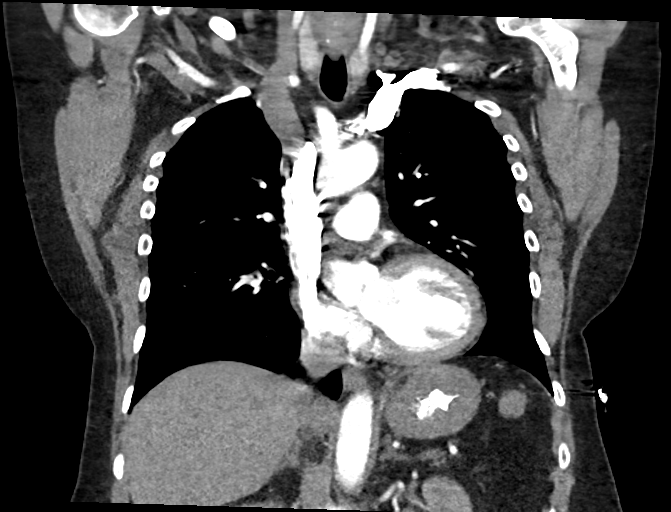
[im 103/137  soft-tissue]
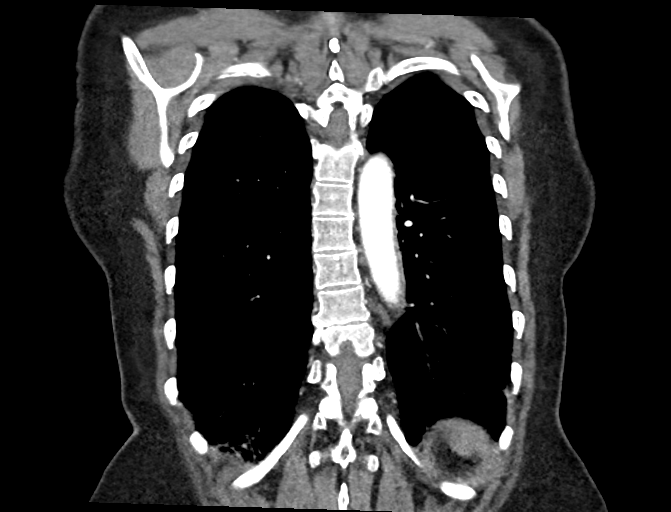

[18 of 46 positions shown; findings below may reference images not displayed]

FINDINGS: Cardiovascular: There is no cardiomegaly or pericardial effusion.
The thoracic aorta is unremarkable. The visualized origins of the
great vessels of the aortic arch appear patent. No pulmonary artery
embolus identified.

Mediastinum/Nodes: Top-normal right hilar lymph node measures 11 mm
in short axis. The esophagus is grossly unremarkable. No mediastinal
fluid collection.

Lungs/Pleura: Minimal left lung base linear atelectasis/scarring. No
focal consolidation, pleural effusion, or pneumothorax. The central
airways are patent.

Upper Abdomen: Cholecystectomy. The visualized upper abdomen is
otherwise unremarkable.

Musculoskeletal: No chest wall abnormality. No acute or significant
osseous findings.

Review of the MIP images confirms the above findings.
IMPRESSION: No acute intrathoracic pathology. No CT evidence of pulmonary
embolism.

## 2023-04-25 LAB — COLOGUARD: COLOGUARD: POSITIVE — AB

## 2024-08-26 ENCOUNTER — Ambulatory Visit

## 2024-08-26 DIAGNOSIS — Z23 Encounter for immunization: Secondary | ICD-10-CM
